# Patient Record
Sex: Female | Born: 1985 | Race: Black or African American | Hispanic: No | Marital: Single | Smoking: Former smoker
Health system: Southern US, Community
[De-identification: ages and names within clinical notes are randomized; demographics above are authoritative.]

## PROBLEM LIST (undated history)

## (undated) ENCOUNTER — Inpatient Hospital Stay (HOSPITAL_COMMUNITY): Payer: Self-pay

## (undated) ENCOUNTER — Inpatient Hospital Stay (HOSPITAL_COMMUNITY)

## (undated) DIAGNOSIS — Z789 Other specified health status: Secondary | ICD-10-CM

## (undated) DIAGNOSIS — A749 Chlamydial infection, unspecified: Secondary | ICD-10-CM

## (undated) HISTORY — PX: WISDOM TOOTH EXTRACTION: SHX21

---
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
## 2007-03-21 ENCOUNTER — Encounter: Admission: RE | Admit: 2007-03-21 | Discharge: 2007-03-21 | Payer: Self-pay | Admitting: Pediatrics

## 2007-08-20 ENCOUNTER — Emergency Department (HOSPITAL_COMMUNITY): Admission: EM | Admit: 2007-08-20 | Discharge: 2007-08-20 | Payer: Self-pay | Admitting: Emergency Medicine

## 2009-12-29 ENCOUNTER — Emergency Department (HOSPITAL_COMMUNITY): Admission: EM | Admit: 2009-12-29 | Discharge: 2009-12-29 | Payer: Self-pay | Admitting: Family Medicine

## 2009-12-29 ENCOUNTER — Emergency Department (HOSPITAL_COMMUNITY): Admission: EM | Admit: 2009-12-29 | Discharge: 2009-12-29 | Payer: Self-pay | Admitting: Emergency Medicine

## 2010-06-21 ENCOUNTER — Emergency Department (HOSPITAL_COMMUNITY)
Admission: EM | Admit: 2010-06-21 | Discharge: 2010-06-21 | Payer: Self-pay | Source: Home / Self Care | Admitting: Emergency Medicine

## 2010-08-16 LAB — DIFFERENTIAL
Basophils Absolute: 0 10*3/uL (ref 0.0–0.1)
Basophils Relative: 0 % (ref 0–1)
Eosinophils Absolute: 0.2 10*3/uL (ref 0.0–0.7)
Monocytes Relative: 4 % (ref 3–12)
Neutrophils Relative %: 66 % (ref 43–77)

## 2010-08-16 LAB — COMPREHENSIVE METABOLIC PANEL
ALT: 8 U/L (ref 0–35)
Alkaline Phosphatase: 73 U/L (ref 39–117)
CO2: 23 mEq/L (ref 19–32)
Chloride: 109 mEq/L (ref 96–112)
GFR calc non Af Amer: >60 mL/min (ref >60)
Glucose, Bld: 88 mg/dL (ref 70–99)
Potassium: 3.8 mEq/L (ref 3.5–5.1)
Sodium: 139 mEq/L (ref 135–145)
Total Bilirubin: 0.3 mg/dL (ref 0.3–1.2)

## 2010-08-16 LAB — CBC
HCT: 32.8 % — ABNORMAL LOW (ref 36.0–46.0)
Hemoglobin: 10.3 g/dL — ABNORMAL LOW (ref 12.0–15.0)
MCV: 79.6 fL (ref 78.0–100.0)
WBC: 8.1 10*3/uL (ref 4.0–10.5)

## 2010-08-16 LAB — POCT PREGNANCY, URINE: Preg Test, Ur: NEGATIVE

## 2010-10-17 NOTE — Discharge Summary (Signed)
[place]. [facility]  Patient:    Jamie Ramos, Jamie Ramos Visit Number: [id number] MRN: [id number]          Service Type: PED Location: PEDS 6123 01 Attending Physician:  Gerrianne Scale Dictated by:   Havery Moros for Alphonzo Lemmings Admit Date:  04/16/2001 Disc. Date: 04/21/01                             Discharge Summary  PRIMARY CARE PHYSICIAN:  Thayer Headings, M.D.  CONSULTS:  None.  DIAGNOSES: 1. Viral meningitis. 2. Headache.  PROCEDURES:  Lumbar tap.  CSF had 55 white blood cells; 43% were PMNs, 38% lymphocytes.  The glucose was 70.  The protein was 24.  CBC showed a white count of 9.2, hemoglobin 11.9, hematocrit 34.9 with platelet count of 274. Blood culture is no growth to date, and CSF culture is no growth to date.  A CT sinus scan was normal.  A UA revealed a concentration of 1.004, a pH of 6.5, uro-bili was 0.2, and it was negative for glucose, bilirubin, protein, blood, nitrites, and leukocyte esterase.  A CT scan of the head on 04/19/01 showed mild opacification of the posterior ethmoid cells and otherwise normal.  PRINCIPAL LABORATORY:  Noted above.  HOSPITAL COURSE:  The patient was admitted for viral meningitis with headache and fever.  She was started on Motrin and Tylenol for her headache and fever and also given a 20 cc per kg normal saline bolus and then started on 1-1/2 times maintenance IV fluids.  Through the course of her hospital stay, her maintenance IV fluids were decreased as her p.o. intake improved.  No antibiotics were started secondary to viral illness.  In order to control the headache, she was started on Motrin scheduled and Tylenol with codeine, giving some relief, but then started on 2 mg of morphine every 4 hours for 24 hours. As her headache improved, she was changed to Percocet, 5 mg of oxycodone for 325 mg of Tylenol and given that every 4 hours, scheduled for 24 hours and then every 4 hours as needed for pain.   This was also given with Motrin 400 mg every 4 hours scheduled.  As her headache improved, she was discharged on Percocet and Motrin.  DISCHARGE MEDICATIONS: 1. Percocet 5/325, 1 tab every 4 hours as needed for pain. 2. Motrin 400 mg every 4 hours as needed for pain.  FOLLOW-UP:  To be scheduled by the patient as needed.  DIET:  Regular.  ACTIVITY:  Unrestricted.  CONDITION ON DISCHARGE:  Improved and stable. Dictated by:   Havery Moros for Alphonzo Lemmings Attending Physician:  Lorain Childes B DD:  04/21/01 TD:  04/21/01 Job: [id number]

## 2011-02-23 LAB — DIFFERENTIAL
Basophils Relative: 0
Eosinophils Absolute: 0.1
Eosinophils Relative: 1
Monocytes Absolute: 0.5
Monocytes Relative: 4

## 2011-02-23 LAB — BASIC METABOLIC PANEL
CO2: 21
Chloride: 108
Creatinine, Ser: 0.69
GFR calc Af Amer: >60

## 2011-02-23 LAB — URINALYSIS, ROUTINE W REFLEX MICROSCOPIC
Glucose, UA: NEGATIVE
Protein, ur: NEGATIVE

## 2011-02-23 LAB — PREGNANCY, URINE: Preg Test, Ur: NEGATIVE

## 2011-02-23 LAB — CBC
Hemoglobin: 8.4 — ABNORMAL LOW
MCHC: 31.7
MCV: 78.9
RBC: 3.35 — ABNORMAL LOW

## 2014-09-06 ENCOUNTER — Emergency Department (HOSPITAL_COMMUNITY)
Admission: EM | Admit: 2014-09-06 | Discharge: 2014-09-06 | Disposition: A | Discharge status: Home / Self Care | Payer: Self-pay | Attending: Emergency Medicine | Admitting: Emergency Medicine

## 2014-09-06 ENCOUNTER — Encounter (HOSPITAL_COMMUNITY): Payer: Self-pay | Admitting: *Deleted

## 2014-09-06 DIAGNOSIS — J069 Acute upper respiratory infection, unspecified: Secondary | ICD-10-CM | POA: Insufficient documentation

## 2014-09-06 LAB — RAPID STREP SCREEN (MED CTR MEBANE ONLY): Streptococcus, Group A Screen (Direct): NEGATIVE

## 2014-09-06 MED ORDER — FLUTICASONE PROPIONATE 50 MCG/ACT NA SUSP: 2.0000 | Freq: Every day | NASAL | Status: DC

## 2014-09-06 NOTE — ED Provider Notes (Signed)
CSN: [id number]     Arrival date & time 09/06/14  1616 History  This chart was scribed for a non-physician practitioner, Kathrynn Speedobyn M Rolland Steinert, PA-C working with Azalia BilisKevin Campos, MD by [place] Peace, ED Scribe. The patient was seen in TR10C/TR10C. The patient's care was started at 4:35 PM.     Chief Complaint  Patient presents with  . Sore Throat      Patient is a 29 y.o. female presenting with pharyngitis. The history is provided by the patient. No language interpreter was used.  Sore Throat  HPI Comments: Shade FloodGlenda Ramos is a 29 y.o. female who presents to the Emergency Department complaining of sore throat x 2 days with dry cough, chills, congestion, and rhinorrhea. She explains throat pain is so bad that she does not even want to eat. 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 Pt has tried taking OTC medications without relief.    History reviewed. No pertinent past medical history. History reviewed. No pertinent past surgical history. History reviewed. No pertinent family history. History  Substance Use Topics  . Smoking status: Never Smoker   . Smokeless tobacco: Not on file  . Alcohol Use: Yes   OB History    No data available     Review of Systems  Constitutional: Positive for chills.  HENT: Positive for congestion, rhinorrhea, sore throat and trouble swallowing.   Respiratory: Positive for cough.   All other systems reviewed and are negative.     Allergies  Review of patient's allergies indicates no known allergies.  Home Medications   Prior to Admission medications   Medication Sig Start Date End Date Taking? Authorizing Provider  fluticasone (FLONASE) 50 MCG/ACT nasal spray Place 2 sprays into both nostrils daily. 09/06/14   Nicole Pisciotta, PA-C   BP 138/87 mmHg  Pulse 109  Temp(Src) 98 F (36.7 C)  Resp 16  Ht 5\' 2"  (1.575 m)  SpO2 100%  LMP 07/22/2014 Physical Exam   Constitutional: She is oriented to person, place, and time. She appears well-developed and well-nourished. No distress.  HENT:  Head: Normocephalic and atraumatic.  Nasal congestion, mucosal edema, postnasal drip.  Eyes: Conjunctivae and EOM are normal.  Neck: Normal range of motion. Neck supple.  Cardiovascular: Normal rate, regular rhythm and normal heart sounds.   Pulmonary/Chest: Effort normal and breath sounds normal. No respiratory distress.  Musculoskeletal: Normal range of motion. She exhibits no edema.  Lymphadenopathy:    She has no cervical adenopathy.  Neurological: She is alert and oriented to person, place, and time. No sensory deficit.  Skin: Skin is warm and dry.  Psychiatric: She has a normal mood and affect. Her behavior is normal.  Nursing note and vitals reviewed.   ED Course  Procedures (including critical care time) Labs Review Labs Reviewed  RAPID STREP SCREEN  CULTURE, GROUP A STREP    Imaging Review No results found.   EKG Interpretation None     Medications - No data to display  4:38 PM- Treatment plan was discussed with patient who verbalizes understanding and agrees.   MDM   Final diagnoses:  URI (upper respiratory infection)   Nontoxic appearing, NAD. AF VSS. No tachycardia on my exam. Nasal congestion, mucosal edema, postnasal drip on exam. Lungs clear. Rapid strep negative. Discussed systematic treatment. Stable for discharge. Return precautions given. Patient states understanding of treatment care plan and is agreeable.  I personally  performed the services described in this documentation, which was scribed in my presence. The recorded information has been reviewed and is accurate.  Kathrynn Speed, PA-C 09/06/14 1730  Azalia Bilis, MD 09/06/14 [id number]

## 2014-09-06 NOTE — Discharge Instructions (Signed)
Use nasal spray as directed. Rest and stay well-hydrated.  Upper Respiratory Infection, Adult An upper respiratory infection (URI) is also sometimes known as the common cold. The upper respiratory tract includes the nose, sinuses, throat, trachea, and bronchi. Bronchi are the airways leading to the lungs. Most people improve within 1 week, but symptoms can last up to 2 weeks. A residual cough may last even longer.  CAUSES Many different viruses can infect the tissues lining the upper respiratory tract. The tissues become irritated and inflamed and often become very moist. Mucus production is also common. A cold is contagious. You can easily spread the virus to others by oral contact. This includes kissing, sharing a glass, coughing, or sneezing. Touching your mouth or nose and then touching a surface, which is then touched by another person, can also spread the virus. SYMPTOMS  Symptoms typically develop 1 to 3 days after you come in contact with a cold virus. Symptoms vary from person to person. They may include:  Runny nose.  Sneezing.  Nasal congestion.  Sinus irritation.  Sore throat.  Loss of voice (laryngitis).  Cough.  Fatigue.  Muscle aches.  Loss of appetite.  Headache.  Low-grade fever. DIAGNOSIS  You might diagnose your own cold based on familiar symptoms, since most people get a cold 2 to 3 times a year. Your caregiver can confirm this based on your exam. Most importantly, your caregiver can check that your symptoms are not due to another disease such as strep throat, sinusitis, pneumonia, asthma, or epiglottitis. Blood tests, throat tests, and X-rays are not necessary to diagnose a common cold, but they may sometimes be helpful in excluding other more serious diseases. Your caregiver will decide if any further tests are required. RISKS AND COMPLICATIONS  You may be at risk for a more severe case of the common cold if you smoke cigarettes, have chronic heart disease  (such as heart failure) or lung disease (such as asthma), or if you have a weakened immune system. The very young and very old are also at risk for more serious infections. Bacterial sinusitis, middle ear infections, and bacterial pneumonia can complicate the common cold. The common cold can worsen asthma and chronic obstructive pulmonary disease (COPD). Sometimes, these complications can require emergency medical care and may be life-threatening. PREVENTION  The best way to protect against getting a cold is to practice good hygiene. Avoid oral or hand contact with people with cold symptoms. Wash your hands often if contact occurs. There is no clear evidence that vitamin C, vitamin E, echinacea, or exercise reduces the chance of developing a cold. However, it is always recommended to get plenty of rest and practice good nutrition. TREATMENT  Treatment is directed at relieving symptoms. There is no cure. Antibiotics are not effective, because the infection is caused by a virus, not by bacteria. Treatment may include:  Increased fluid intake. Sports drinks offer valuable electrolytes, sugars, and fluids.  Breathing heated mist or steam (vaporizer or shower).  Eating chicken soup or other clear broths, and maintaining good nutrition.  Getting plenty of rest.  Using gargles or lozenges for comfort.  Controlling fevers with ibuprofen or acetaminophen as directed by your caregiver.  Increasing usage of your inhaler if you have asthma. Zinc gel and zinc lozenges, taken in the first 24 hours of the common cold, can shorten the duration and lessen the severity of symptoms. Pain medicines may help with fever, muscle aches, and throat pain. A variety of non-prescription medicines  are available to treat congestion and runny nose. Your caregiver can make recommendations and may suggest nasal or lung inhalers for other symptoms.  HOME CARE INSTRUCTIONS   Only take over-the-counter or prescription medicines  for pain, discomfort, or fever as directed by your caregiver.  Use a warm mist humidifier or inhale steam from a shower to increase air moisture. This may keep secretions moist and make it easier to breathe.  Drink enough water and fluids to keep your urine clear or pale yellow.  Rest as needed.  Return to work when your temperature has returned to normal or as your caregiver advises. You may need to stay home longer to avoid infecting others. You can also use a face mask and careful hand washing to prevent spread of the virus. SEEK MEDICAL CARE IF:   After the first few days, you feel you are getting worse rather than better.  You need your caregiver's advice about medicines to control symptoms.  You develop chills, worsening shortness of breath, or brown or red sputum. These may be signs of pneumonia.  You develop yellow or brown nasal discharge or pain in the face, especially when you bend forward. These may be signs of sinusitis.  You develop a fever, swollen neck glands, pain with swallowing, or white areas in the back of your throat. These may be signs of strep throat. SEEK IMMEDIATE MEDICAL CARE IF:   You have a fever.  You develop severe or persistent headache, ear pain, sinus pain, or chest pain.  You develop wheezing, a prolonged cough, cough up blood, or have a change in your usual mucus (if you have chronic lung disease).  You develop sore muscles or a stiff neck. Document Released: 11/11/2000 Document Revised: 08/10/2011 Document Reviewed: 08/23/2013 [facility] Patient Information 2015 [employer] [place]. This information is not intended to replace advice given to you by your health care provider. Make sure you discuss any questions you have with your health care provider.

## 2014-09-06 NOTE — ED Notes (Signed)
Pt to ED c/o sore throat and chills since Tuesday. Reports painful to swallow. Also c/o NVD, last diarrhea was on Monday. 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

## 2014-09-06 NOTE — ED Notes (Signed)
Pt c/o sore throat and chills since Tuesday. Reports difficulty swallowing. Pt has tried OTC cold medications without relief

## 2014-09-10 LAB — CULTURE, GROUP A STREP

## 2015-12-12 ENCOUNTER — Encounter (HOSPITAL_COMMUNITY): Payer: Self-pay | Admitting: *Deleted

## 2015-12-12 DIAGNOSIS — N760 Acute vaginitis: Secondary | ICD-10-CM | POA: Insufficient documentation

## 2015-12-12 DIAGNOSIS — B9689 Other specified bacterial agents as the cause of diseases classified elsewhere: Secondary | ICD-10-CM | POA: Insufficient documentation

## 2015-12-12 LAB — URINALYSIS, ROUTINE W REFLEX MICROSCOPIC
GLUCOSE, UA: NEGATIVE mg/dL
HGB URINE DIPSTICK: NEGATIVE
KETONES UR: 15 mg/dL — AB
Nitrite: NEGATIVE
PROTEIN: NEGATIVE mg/dL
Specific Gravity, Urine: 1.033 — ABNORMAL HIGH (ref 1.005–1.030)
pH: 7 (ref 5.0–8.0)

## 2015-12-12 LAB — CBC
HCT: 37 % (ref 36.0–46.0)
Hemoglobin: 11.8 g/dL — ABNORMAL LOW (ref 12.0–15.0)
MCH: 25.8 pg — AB (ref 26.0–34.0)
MCHC: 31.9 g/dL (ref 30.0–36.0)
MCV: 81 fL (ref 78.0–100.0)
PLATELETS: 292 10*3/uL (ref 150–400)
RBC: 4.57 MIL/uL (ref 3.87–5.11)
RDW: 18 % — AB (ref 11.5–15.5)
WBC: 11.8 10*3/uL — AB (ref 4.0–10.5)

## 2015-12-12 LAB — COMPREHENSIVE METABOLIC PANEL
ALK PHOS: 50 U/L (ref 38–126)
ALT: 10 U/L — AB (ref 14–54)
AST: 22 U/L (ref 15–41)
Albumin: 4.3 g/dL (ref 3.5–5.0)
Anion gap: 6 (ref 5–15)
BUN: 7 mg/dL (ref 6–20)
CHLORIDE: 109 mmol/L (ref 101–111)
CO2: 23 mmol/L (ref 22–32)
CREATININE: 0.76 mg/dL (ref 0.44–1.00)
Calcium: 9.3 mg/dL (ref 8.9–10.3)
GFR calc Af Amer: >60 mL/min (ref >60)
Glucose, Bld: 97 mg/dL (ref 65–99)
Potassium: 4 mmol/L (ref 3.5–5.1)
Sodium: 138 mmol/L (ref 135–145)
Total Bilirubin: 0.6 mg/dL (ref 0.3–1.2)
Total Protein: 7.6 g/dL (ref 6.5–8.1)

## 2015-12-12 LAB — URINE MICROSCOPIC-ADD ON

## 2015-12-12 LAB — POC URINE PREG, ED: Preg Test, Ur: NEGATIVE

## 2015-12-12 LAB — LIPASE, BLOOD: LIPASE: 25 U/L (ref 11–51)

## 2015-12-12 NOTE — ED Notes (Signed)
Pt states that she vomited this morning and when she went to the bathroom she had some clear vaginal discharge. Endorses lower abd pain.

## 2015-12-13 ENCOUNTER — Emergency Department (HOSPITAL_COMMUNITY)
Admission: EM | Admit: 2015-12-13 | Discharge: 2015-12-13 | Disposition: A | Discharge status: Home / Self Care | Payer: Self-pay | Attending: Emergency Medicine | Admitting: Emergency Medicine

## 2015-12-13 ENCOUNTER — Emergency Department (HOSPITAL_COMMUNITY): Payer: Self-pay

## 2015-12-13 DIAGNOSIS — N760 Acute vaginitis: Secondary | ICD-10-CM

## 2015-12-13 DIAGNOSIS — B9689 Other specified bacterial agents as the cause of diseases classified elsewhere: Secondary | ICD-10-CM

## 2015-12-13 DIAGNOSIS — R1031 Right lower quadrant pain: Secondary | ICD-10-CM

## 2015-12-13 LAB — GC/CHLAMYDIA PROBE AMP (~~LOC~~) NOT AT ARMC
Chlamydia: NEGATIVE
Neisseria Gonorrhea: NEGATIVE

## 2015-12-13 LAB — WET PREP, GENITAL
SPERM: NONE SEEN
Trich, Wet Prep: NONE SEEN
Yeast Wet Prep HPF POC: NONE SEEN

## 2015-12-13 LAB — HIV ANTIBODY (ROUTINE TESTING W REFLEX): HIV Screen 4th Generation wRfx: NONREACTIVE

## 2015-12-13 MED ORDER — METRONIDAZOLE 500 MG PO TABS: 500.0000 mg | ORAL_TABLET | Freq: Two times a day (BID) | ORAL | Status: DC

## 2015-12-13 MED ORDER — AZITHROMYCIN 250 MG PO TABS
1000.0000 mg | ORAL_TABLET | Freq: Once | ORAL | Status: AC
  Administered 2015-12-13: 1000 mg via ORAL
  Filled 2015-12-13: qty 4

## 2015-12-13 MED ORDER — CEFTRIAXONE SODIUM 250 MG IJ SOLR
250.0000 mg | Freq: Once | INTRAMUSCULAR | Status: AC
  Administered 2015-12-13: 250 mg via INTRAMUSCULAR
  Filled 2015-12-13: qty 250

## 2015-12-13 NOTE — ED Provider Notes (Signed)
CSN: [id number]     Arrival date & time 12/12/15  2150 History   First MD Initiated Contact with Patient 12/13/15 0204     Chief Complaint  Patient presents with  . Emesis  . Vaginal Discharge     (Consider location/radiation/quality/duration/timing/severity/associated sxs/prior Treatment) HPI Comments: Patient presents today with a chief complaint of lower abdominal cramping and vaginal discharge onset earlier today.  She states that the discharge was whitish in color and did not notice an odor to it.  She reports one episode of vomiting this morning, but denies any nausea or vomiting at this time.  Denies diarrhea, constipation, abnormal vaginal bleeding, fever, chills, or urinary symptoms.  She is sexually active.  LMP was 11/26/15 and was normal.    The history is provided by the patient.    History reviewed. No pertinent past medical history. History reviewed. No pertinent past surgical history. No family history on file. Social History  Substance Use Topics  . Smoking status: Never Smoker   . Smokeless tobacco: None  . Alcohol Use: Yes   OB History    No data available     Review of Systems  All other systems reviewed and are negative.     Allergies  Review of patient's allergies indicates no known allergies.  Home Medications   Prior to Admission medications   Medication Sig Start Date End Date Taking? Authorizing Provider  fluticasone (FLONASE) 50 MCG/ACT nasal spray Place 2 sprays into both nostrils daily. Patient not taking: Reported on 12/13/2015 09/06/14   Joni Reining Pisciotta, PA-C   BP 142/96 mmHg  Pulse 101  Temp(Src) 98.7 F (37.1 C) (Oral)  Resp 16  Ht  (1.575 m)  Wt 46.834 kg  BMI 18.88 kg/m2  SpO2 100%  LMP 11/26/2015 Physical Exam  Constitutional: She appears well-developed and well-nourished.  HENT:  Head: Normocephalic and atraumatic.  Mouth/Throat: Oropharynx is clear and moist.  Neck: Normal range of motion. Neck supple.   Cardiovascular: Normal rate, regular rhythm and normal heart sounds.   Pulmonary/Chest: Effort normal and breath sounds normal.  Abdominal: Soft. Bowel sounds are normal. She exhibits no distension and no mass. There is no tenderness. There is no rebound and no guarding.  Genitourinary: Cervix exhibits no motion tenderness. Right adnexum displays tenderness. Right adnexum displays no mass and no fullness. Left adnexum displays no mass, no tenderness and no fullness.  Whitish colored discharge in the vaginal vault  Musculoskeletal: Normal range of motion.  Neurological: She is alert.  Skin: Skin is warm and dry.  Psychiatric: She has a normal mood and affect.  Nursing note and vitals reviewed.   ED Course  Procedures (including critical care time) Labs Review Labs Reviewed  COMPREHENSIVE METABOLIC PANEL - Abnormal; Notable for the following:    ALT 10 (*)    All other components within normal limits  CBC - Abnormal; Notable for the following:    WBC 11.8 (*)    Hemoglobin 11.8 (*)    MCH 25.8 (*)    RDW 18.0 (*)    All other components within normal limits  URINALYSIS, ROUTINE W REFLEX MICROSCOPIC (NOT AT [facility]) - Abnormal; Notable for the following:    Color, Urine AMBER (*)    Specific Gravity, Urine 1.033 (*)    Bilirubin Urine SMALL (*)    Ketones, ur 15 (*)    Leukocytes, UA TRACE (*)    All other components within normal limits  URINE MICROSCOPIC-ADD ON - Abnormal; Notable for  the following:    Squamous Epithelial / LPF 6-30 (*)    Bacteria, UA FEW (*)    All other components within normal limits  WET PREP, GENITAL  LIPASE, BLOOD  HIV ANTIBODY (ROUTINE TESTING)  POC URINE PREG, ED  GC/CHLAMYDIA PROBE AMP ([place]) [facility]    Imaging Review [place] Transvaginal Non-ob  12/13/2015  CLINICAL DATA:  Lower pelvic pain for 1 day, greater on the right. Negative pregnancy test. EXAM: TRANSABDOMINAL AND TRANSVAGINAL ULTRASOUND OF PELVIS TECHNIQUE: Both transabdominal and  transvaginal ultrasound examinations of the pelvis were performed. Transabdominal technique was performed for global imaging of the pelvis including uterus, ovaries, adnexal regions, and pelvic cul-de-sac. It was necessary to proceed with endovaginal exam following the transabdominal exam to visualize the uterus and ovaries. COMPARISON:  CT abdomen and pelvis 12/29/2009 FINDINGS: Uterus Measurements: 5.1 x 3.3 x 4 cm. No fibroids or other mass visualized. Endometrium Thickness: 9 mm.  No focal abnormality visualized. Right ovary Measurements: 2.5 x 1.7 x 2.5 cm. Normal follicular changes are demonstrated. No abnormal adnexal masses. Left ovary Measurements: 2.4 x 1.4 x 1.7 cm. Normal follicular changes are demonstrated. No abnormal adnexal masses. Other findings No abnormal free fluid. IMPRESSION: Normal ultrasound appearance of the uterus and ovaries. Electronically Signed   By: Burman NievesWilliam  Stevens M.D.   On: 12/13/2015 04:49   [place] Pelvis Complete  12/13/2015  CLINICAL DATA:  Lower pelvic pain for 1 day, greater on the right. Negative pregnancy test. EXAM: TRANSABDOMINAL AND TRANSVAGINAL ULTRASOUND OF PELVIS TECHNIQUE: Both transabdominal and transvaginal ultrasound examinations of the pelvis were performed. Transabdominal technique was performed for global imaging of the pelvis including uterus, ovaries, adnexal regions, and pelvic cul-de-sac. It was necessary to proceed with endovaginal exam following the transabdominal exam to visualize the uterus and ovaries. COMPARISON:  CT abdomen and pelvis 12/29/2009 FINDINGS: Uterus Measurements: 5.1 x 3.3 x 4 cm. No fibroids or other mass visualized. Endometrium Thickness: 9 mm.  No focal abnormality visualized. Right ovary Measurements: 2.5 x 1.7 x 2.5 cm. Normal follicular changes are demonstrated. No abnormal adnexal masses. Left ovary Measurements: 2.4 x 1.4 x 1.7 cm. Normal follicular changes are demonstrated. No abnormal adnexal masses. Other findings No abnormal  free fluid. IMPRESSION: Normal ultrasound appearance of the uterus and ovaries. Electronically Signed   By: Burman NievesWilliam  Stevens M.D.   On: 12/13/2015 04:49   I have personally reviewed and evaluated these images and lab results as part of my medical decision-making.   EKG Interpretation None      MDM   Final diagnoses:  RLQ abdominal pain   Patient presents today with a chief complaint of vaginal discharge and lower abdominal cramping.  Urine pregnancy is negative.  Wet prep showing clue cells consistent with BV.  GC/Chlamydia pending.  Patient given empiric treatment with Azithromycin and Rocephin.  No CMT with pelvic exam.  Right adnexal tenderness on exam.  Pelvic ultrasound is negative.  Patient stable for discharge.  Return precautions given.      Santiago GladHeather Gurnoor Ursua, PA-C 12/15/15 1536  Tomasita CrumbleAdeleke Oni, MD 12/15/15 2340

## 2015-12-13 NOTE — ED Notes (Signed)
Patient transported to Ultrasound 

## 2016-05-21 ENCOUNTER — Encounter (HOSPITAL_COMMUNITY): Payer: Self-pay

## 2016-05-21 ENCOUNTER — Emergency Department (HOSPITAL_COMMUNITY)
Admission: EM | Admit: 2016-05-21 | Discharge: 2016-05-21 | Disposition: A | Discharge status: Home / Self Care | Payer: Self-pay | Attending: Emergency Medicine | Admitting: Emergency Medicine

## 2016-05-21 DIAGNOSIS — Y929 Unspecified place or not applicable: Secondary | ICD-10-CM | POA: Insufficient documentation

## 2016-05-21 DIAGNOSIS — S0501XA Injury of conjunctiva and corneal abrasion without foreign body, right eye, initial encounter: Secondary | ICD-10-CM | POA: Insufficient documentation

## 2016-05-21 DIAGNOSIS — X58XXXA Exposure to other specified factors, initial encounter: Secondary | ICD-10-CM | POA: Insufficient documentation

## 2016-05-21 DIAGNOSIS — Y939 Activity, unspecified: Secondary | ICD-10-CM | POA: Insufficient documentation

## 2016-05-21 DIAGNOSIS — Y999 Unspecified external cause status: Secondary | ICD-10-CM | POA: Insufficient documentation

## 2016-05-21 MED ORDER — ERYTHROMYCIN 5 MG/GM OP OINT: TOPICAL_OINTMENT | OPHTHALMIC | 0 refills | Status: DC

## 2016-05-21 MED ORDER — FLUORESCEIN SODIUM 0.6 MG OP STRP
1.0000 | ORAL_STRIP | Freq: Once | OPHTHALMIC | Status: AC
  Administered 2016-05-21: 1 via OPHTHALMIC
  Filled 2016-05-21: qty 1

## 2016-05-21 MED ORDER — TETRACAINE HCL 0.5 % OP SOLN
2.0000 [drp] | Freq: Once | OPHTHALMIC | Status: AC
  Administered 2016-05-21: 2 [drp] via OPHTHALMIC
  Filled 2016-05-21: qty 2

## 2016-05-21 NOTE — ED Provider Notes (Signed)
[facility]-EMERGENCY DEPT Provider Note   CSN: [id number] Arrival date & time: 05/21/16  [id number]   By signing my name below, I, Jamie Ramos, attest that this documentation has been prepared under the direction and in the presence of [facility] Hospital[facility] Burl Tauzin, PA-C. Electronically Signed: Talbert NanPaul Ramos, Scribe. 05/21/16. 10:19 AM. History   Chief Complaint Chief Complaint  Patient presents with  . Conjunctivitis   HPI Comments: Jamie Ramos is a 30 y.o. female who presents to the Emergency Department complaining of gradual onset constant, gradually improving eye redness and irritation that began 2 days ago. The patient says that she wears cosmetic contact lens and had to remove it due to the irritation. She describes the irritation as a burning sensation. She placed a warm compress and applied OTC eye drops with limited relief. She states the following morning she woke up to eye swelling and continued redness. She reports associated photophobia. She denies congestion, eye matting.     The history is provided by the patient. No language interpreter was used.    History reviewed. No pertinent past medical history.  There are no active problems to display for this patient.   History reviewed. No pertinent surgical history.  OB History    No data available       Home Medications    Prior to Admission medications   Medication Sig Start Date End Date Taking? Authorizing Provider  erythromycin ophthalmic ointment Place a 1/2 inch ribbon of ointment into the lower eyelid x 1 week 05/21/16   [facility] Pilcher Stevenson Windmiller, PA-C  fluticasone [facility](FLONASE) 50 MCG/ACT nasal spray Place 2 sprays into both nostrils daily. Patient not taking: Reported on 12/13/2015 09/06/14   Joni ReiningNicole Pisciotta, PA-C  metroNIDAZOLE (FLAGYL) 500 MG tablet Take 1 tablet (500 mg total) by mouth 2 (two) times daily. 12/13/15   Santiago GladHeather Laisure, PA-C    Family History No family history on file.  Social History Social History  Substance Use Topics  .  Smoking status: Never Smoker  . Smokeless tobacco: Not on file  . Alcohol use Yes     Allergies   Patient has no known allergies.   Review of Systems Review of Systems  Constitutional: Negative for fever.  HENT: Negative for congestion.   Eyes: Positive for photophobia, pain, redness and itching. Negative for discharge.     Physical Exam Updated Vital Signs BP 124/79 (BP Location: Left Arm)   Pulse 89   Temp 97.9 F (36.6 C) (Oral)   Resp 14   Ht 5\' 2"  (1.575 m)   Wt 48.5 kg   LMP 04/12/2016 (Within Days)   SpO2 98%   BMI 19.57 kg/m   Physical Exam  Constitutional: She is oriented to person, place, and time. She appears well-developed and well-nourished. No distress.  HENT:  Head: Normocephalic and atraumatic.  Eyes: EOM and lids are normal. Pupils are equal, round, and reactive to light. Lids are everted and swept, no foreign bodies found. Right conjunctiva is injected. Left conjunctiva is not injected.    Fluorescein uptake as depicted in image  Cardiovascular: Normal rate, regular rhythm and normal heart sounds.   No murmur heard. Pulmonary/Chest: Effort normal and breath sounds normal. No respiratory distress.  Abdominal: Soft. She exhibits no distension. There is no tenderness.  Musculoskeletal: She exhibits no edema.  Neurological: She is alert and oriented to person, place, and time.  Skin: Skin is warm and dry.  Nursing note and vitals reviewed.    ED Treatments / Results  DIAGNOSTIC STUDIES: Oxygen Saturation is 98% on room air, normal by my interpretation.    COORDINATION OF CARE: 10:15 AM Discussed treatment plan with pt at bedside and pt agreed to plan.    Labs (all labs ordered are listed, but only abnormal results are displayed) Labs Reviewed - No data to display  EKG  EKG Interpretation None       Radiology No results found.  Procedures Procedures (including critical care time)  Medications Ordered in ED Medications    tetracaine (PONTOCAINE) 0.5 % ophthalmic solution 2 drop (2 drops Right Eye Given 05/21/16 1019)  fluorescein ophthalmic strip 1 strip (1 strip Right Eye Given 05/21/16 1019)     Initial Impression / Assessment and Plan / ED Course  I have reviewed the triage vital signs and the nursing notes.  Pertinent labs & imaging results that were available during my care of the patient were reviewed by me and considered in my medical decision making (see chart for details).  Clinical Course    Jamie Ramos Wentling is a 30 y.o. female who presents to ED for right eye redness and foreign body sensation. Eye was stained and shows evidence of corneal abrasion. Conjunctiva is injected as well. Will treat with erythromycin ointment. Personal hygiene and frequent handwashing discussed. Patient advised to follow up with ophthalmologist if symptoms persist or worsen. Return precautions discussed. Patient verbalizes understanding and is agreeable with discharge.   Final Clinical Impressions(s) / ED Diagnoses   Final diagnoses:  Abrasion of right cornea, initial encounter    New Prescriptions Discharge Medication List as of 05/21/2016 10:22 AM    START taking these medications   Details  erythromycin ophthalmic ointment Place a 1/2 inch ribbon of ointment into the lower eyelid x 1 week, Print        I personally performed the services described in this documentation, which was scribed in my presence. The recorded information has been reviewed and is accurate.     [facility] Pilcher Opaline Reyburn, PA-C 05/21/16 1132    Gwyneth SproutWhitney Plunkett, MD 05/23/16 [id number]

## 2016-05-21 NOTE — Discharge Instructions (Signed)
It was my pleasure taking care of you today!  Please take all of your antibiotics until finished!  If your symptoms do not improve in 3 days, please follow up with an eye doctor of your choice or the eye doctor listed. Return to ER for new or worsening symptoms, any additional concerns.

## 2016-05-21 NOTE — ED Triage Notes (Signed)
Pt presents for evaluation of possible pink eye to R eye since Tuesday. Pt. States she does wear contacts, states has been using OTC eye drops with no improvement. Pt AxO x4, in NAD.

## 2016-05-21 NOTE — ED Notes (Signed)
Pt is in stable condition upon d/c and ambulates from ED. 

## 2016-08-16 ENCOUNTER — Encounter (HOSPITAL_COMMUNITY): Payer: Self-pay | Admitting: *Deleted

## 2016-08-16 ENCOUNTER — Emergency Department (HOSPITAL_COMMUNITY)
Admission: EM | Admit: 2016-08-16 | Discharge: 2016-08-16 | Disposition: A | Discharge status: Home / Self Care | Payer: Self-pay | Attending: Emergency Medicine | Admitting: Emergency Medicine

## 2016-08-16 ENCOUNTER — Emergency Department (HOSPITAL_COMMUNITY): Payer: Self-pay

## 2016-08-16 DIAGNOSIS — R0789 Other chest pain: Secondary | ICD-10-CM | POA: Insufficient documentation

## 2016-08-16 DIAGNOSIS — Z79899 Other long term (current) drug therapy: Secondary | ICD-10-CM | POA: Insufficient documentation

## 2016-08-16 DIAGNOSIS — F17200 Nicotine dependence, unspecified, uncomplicated: Secondary | ICD-10-CM | POA: Insufficient documentation

## 2016-08-16 MED ORDER — IBUPROFEN 600 MG PO TABS: 600.0000 mg | ORAL_TABLET | Freq: Four times a day (QID) | ORAL | 0 refills | Status: DC | PRN

## 2016-08-16 NOTE — ED Triage Notes (Signed)
Since [place] food [place] Monday, feels like something may be in epigastric area, little anxiety causing her to feel as if she has to make herself take a deep breath. URI symptoms with congestion

## 2016-08-16 NOTE — ED Provider Notes (Signed)
[facility]-EMERGENCY DEPT Provider Note   CSN: [id number] Arrival date & time: 08/16/16  1228     History   Chief Complaint Chief Complaint  Patient presents with  . Chest Pain    HPI Jamie Ramos is a 31 y.o. female.  The history is provided by the patient.  Chest Pain   This is a new problem. The current episode started more than 1 week ago. The problem occurs constantly. The problem has not changed since onset.The pain is associated with an emotional upset (ate chips at [employer] last week and it got caught in her esophagus. ). The pain is present in the substernal region. The pain is moderate. The quality of the pain is described as pressure-like. The pain does not radiate. Associated symptoms include shortness of breath. Pertinent negatives include no cough, no dizziness, no exertional chest pressure, no fever, no irregular heartbeat, no lower extremity edema, no malaise/fatigue, no orthopnea and no sputum production. Risk factors include smoking/tobacco exposure.  Pertinent negatives for past medical history include no CAD, no COPD, no CHF, no diabetes, no DVT, no hyperlipidemia, no hypertension, no MI and no PE.    History reviewed. No pertinent past medical history.  There are no active problems to display for this patient.   History reviewed. No pertinent surgical history.  OB History    No data available       Home Medications    Prior to Admission medications   Medication Sig Start Date End Date Taking? Authorizing Provider  Phenylephrine-DM-GG-APAP (DELSYM COUGH/COLD DAYTIME) 5-10-200-325 MG/10ML LIQD Take 30 mLs by mouth every 6 (six) hours as needed (COUGH,COLD).   Yes Historical Provider, MD  erythromycin ophthalmic ointment Place a 1/2 inch ribbon of ointment into the lower eyelid x 1 week Patient not taking: Reported on 08/16/2016 05/21/16   [facility], PA-C  ibuprofen (ADVIL,MOTRIN) 600 MG tablet Take 1 tablet (600 mg total) by mouth every 6  (six) hours as needed. 08/16/16   Nira Conn, MD    Family History No family history on file.  Social History Social History  Substance Use Topics  . Smoking status: Current Some Day Smoker  . Smokeless tobacco: Never Used  . Alcohol use Yes     Allergies   Patient has no known allergies.   Review of Systems Review of Systems  Constitutional: Negative for fever and malaise/fatigue.  Respiratory: Positive for shortness of breath. Negative for cough and sputum production.   Cardiovascular: Positive for chest pain. Negative for orthopnea.  Neurological: Negative for dizziness.  Ten systems are reviewed and are negative for acute change except as noted in the HPI    Physical Exam Updated Vital Signs BP (!) 137/92   Pulse 86   Temp 98.2 F (36.8 C)   Resp 18   Ht 5\' 2"  (1.575 m)   Wt 112 lb (50.8 kg)   SpO2 100%   BMI 20.49 kg/m   Physical Exam  Constitutional: She is oriented to person, place, and time. She appears well-developed and well-nourished. No distress.  HENT:  Head: Normocephalic and atraumatic.  Nose: Nose normal.  Eyes: Conjunctivae and EOM are normal. Pupils are equal, round, and reactive to light. Right eye exhibits no discharge. Left eye exhibits no discharge. No scleral icterus.  Neck: Normal range of motion. Neck supple.  Cardiovascular: Normal rate and regular rhythm.  Exam reveals no gallop and no friction rub.   No murmur heard. Pulmonary/Chest: Effort normal and breath  sounds normal. No stridor. No respiratory distress. She has no rales. She exhibits tenderness.    Abdominal: Soft. She exhibits no distension. There is no tenderness.  Musculoskeletal: She exhibits no edema or tenderness.  Neurological: She is alert and oriented to person, place, and time.  Skin: Skin is warm and dry. No rash noted. She is not diaphoretic. No erythema.  Psychiatric: She has a normal mood and affect.  Vitals reviewed.    ED Treatments / Results    Labs (all labs ordered are listed, but only abnormal results are displayed) Labs Reviewed - No data to display  EKG  EKG Interpretation  Date/Time:  Sunday August 16 2016 16:28:06 EDT Ventricular Rate:  68 PR Interval:    QRS Duration: 84 QT Interval:  395 QTC Calculation: 421 R Axis:   66 Text Interpretation:  Sinus rhythm Probable left atrial enlargement No STEMI No old tracing to compare Confirmed by [facility] LPCARDAMA MD, PEDRO [id number]) on 08/16/2016 4:40:54 PM       Radiology Dg Chest 2 View  Result Date: 08/16/2016 CLINICAL DATA:  Dry cough and mid chest discomfort. EXAM: CHEST  2 VIEW COMPARISON:  None. FINDINGS: The heart size and mediastinal contours are within normal limits. Both lungs are clear. The visualized skeletal structures are unremarkable. IMPRESSION: No active cardiopulmonary disease. Electronically Signed   By: Ted Mcalpineobrinka  Dimitrova M.D.   On: 08/16/2016 14:03    Procedures Procedures (including critical care time)  Medications Ordered in ED Medications - No data to display   Initial Impression / Assessment and Plan / ED Course  I have reviewed the triage vital signs and the nursing notes.  Pertinent labs & imaging results that were available during my care of the patient were reviewed by me and considered in my medical decision making (see chart for details).     Atypical chest pain inconsistent with ACS.  EKG: Normal sinus rhythm, intervals, axis. No evidence of acute ischemia, arrhythmias, or blocks. Chest x-ray without evidence suggestive of pneumonia, pneumothorax, pneumomediastinum.  No abnormal contour of the mediastinum to suggest dissection. No evidence of acute injuries. PERC negative. Not classic for dissection or esophageal perforation. Most consistent with chest wall pain given the complete reproduction with palpation.  The patient is safe for discharge with strict return precautions.   Final Clinical Impressions(s) / ED Diagnoses   Final diagnoses:   Chest wall pain   Disposition: Discharge  Condition: Good  I have discussed the results, Dx and Tx plan with the patient who expressed understanding and agree(s) with the plan. Discharge instructions discussed at great length. The patient was given strict return precautions who verbalized understanding of the instructions. No further questions at time of discharge.    New Prescriptions   IBUPROFEN (ADVIL,MOTRIN) 600 MG TABLET    Take 1 tablet (600 mg total) by mouth every 6 (six) hours as needed.    Follow Up: Billee CashingWayland McKenzie, MD [address] [place] [id number]  Schedule an appointment as soon as possible for a visit  in 5-7 days, If symptoms do not improve or  worsen      Nira ConnPedro Eduardo Cardama, MD 08/16/16 1642

## 2017-06-01 DIAGNOSIS — A749 Chlamydial infection, unspecified: Secondary | ICD-10-CM

## 2017-06-01 HISTORY — DX: Chlamydial infection, unspecified: A74.9

## 2018-04-04 ENCOUNTER — Inpatient Hospital Stay (HOSPITAL_COMMUNITY): Payer: Medicaid Other

## 2018-04-04 ENCOUNTER — Inpatient Hospital Stay (HOSPITAL_COMMUNITY)
Admission: AD | Admit: 2018-04-04 | Discharge: 2018-04-04 | Disposition: A | Discharge status: Home / Self Care | Payer: Medicaid Other | Source: Ambulatory Visit | Attending: Obstetrics and Gynecology | Admitting: Obstetrics and Gynecology

## 2018-04-04 ENCOUNTER — Other Ambulatory Visit: Payer: Self-pay

## 2018-04-04 ENCOUNTER — Encounter (HOSPITAL_COMMUNITY): Payer: Self-pay | Admitting: *Deleted

## 2018-04-04 DIAGNOSIS — R1031 Right lower quadrant pain: Secondary | ICD-10-CM | POA: Diagnosis present

## 2018-04-04 DIAGNOSIS — O26891 Other specified pregnancy related conditions, first trimester: Secondary | ICD-10-CM

## 2018-04-04 DIAGNOSIS — Z3A01 Less than 8 weeks gestation of pregnancy: Secondary | ICD-10-CM | POA: Diagnosis not present

## 2018-04-04 DIAGNOSIS — O23591 Infection of other part of genital tract in pregnancy, first trimester: Secondary | ICD-10-CM | POA: Diagnosis not present

## 2018-04-04 DIAGNOSIS — O23599 Infection of other part of genital tract in pregnancy, unspecified trimester: Secondary | ICD-10-CM

## 2018-04-04 DIAGNOSIS — B9689 Other specified bacterial agents as the cause of diseases classified elsewhere: Secondary | ICD-10-CM | POA: Diagnosis not present

## 2018-04-04 DIAGNOSIS — R109 Unspecified abdominal pain: Secondary | ICD-10-CM

## 2018-04-04 HISTORY — DX: Other specified health status: Z78.9

## 2018-04-04 LAB — CBC WITH DIFFERENTIAL/PLATELET
Basophils Absolute: 0 10*3/uL (ref 0.0–0.1)
Basophils Relative: 0 %
EOS PCT: 2 %
Eosinophils Absolute: 0.4 10*3/uL (ref 0.0–0.5)
HEMATOCRIT: 45.2 % (ref 36.0–46.0)
Hemoglobin: 15.6 g/dL — ABNORMAL HIGH (ref 12.0–15.0)
LYMPHS ABS: 5.3 10*3/uL — AB (ref 0.7–4.0)
LYMPHS PCT: 34 %
MCH: 33.7 pg (ref 26.0–34.0)
MCHC: 34.5 g/dL (ref 30.0–36.0)
MCV: 97.6 fL (ref 80.0–100.0)
Monocytes Absolute: 0.8 10*3/uL (ref 0.1–1.0)
Monocytes Relative: 5 %
NEUTROS PCT: 59 %
NRBC: 0 % (ref 0.0–0.2)
Neutro Abs: 9.2 10*3/uL — ABNORMAL HIGH (ref 1.7–7.7)
Platelets: 301 10*3/uL (ref 150–400)
RBC: 4.63 MIL/uL (ref 3.87–5.11)
RDW: 12.4 % (ref 11.5–15.5)
WBC: 15.7 10*3/uL — ABNORMAL HIGH (ref 4.0–10.5)

## 2018-04-04 LAB — OB RESULTS CONSOLE ABO/RH: RH Type: POSITIVE

## 2018-04-04 LAB — OB RESULTS CONSOLE RPR: RPR: NONREACTIVE

## 2018-04-04 LAB — URINALYSIS, ROUTINE W REFLEX MICROSCOPIC
Bilirubin Urine: NEGATIVE
GLUCOSE, UA: NEGATIVE mg/dL
Hgb urine dipstick: NEGATIVE
KETONES UR: 5 mg/dL — AB
Nitrite: NEGATIVE
PH: 7 (ref 5.0–8.0)
Protein, ur: NEGATIVE mg/dL
Specific Gravity, Urine: 1.011 (ref 1.005–1.030)

## 2018-04-04 LAB — HCG, QUANTITATIVE, PREGNANCY: hCG, Beta Chain, Quant, S: 16532 m[IU]/mL — ABNORMAL HIGH (ref <5)

## 2018-04-04 LAB — ABO/RH: ABO/RH(D): O POS

## 2018-04-04 LAB — WET PREP, GENITAL
SPERM: NONE SEEN
Trich, Wet Prep: NONE SEEN
Yeast Wet Prep HPF POC: NONE SEEN

## 2018-04-04 LAB — OB RESULTS CONSOLE HEPATITIS B SURFACE ANTIGEN: Hepatitis B Surface Ag: NEGATIVE

## 2018-04-04 LAB — OB RESULTS CONSOLE RUBELLA ANTIBODY, IGM: Rubella: IMMUNE

## 2018-04-04 MED ORDER — METRONIDAZOLE 500 MG PO TABS: 500.0000 mg | ORAL_TABLET | Freq: Two times a day (BID) | ORAL | 0 refills | Status: AC

## 2018-04-04 NOTE — Discharge Instructions (Signed)
Abdominal Pain During Pregnancy °Abdominal pain is common in pregnancy. Most of the time, it does not cause harm. There are many causes of abdominal pain. Some causes are more serious than others and sometimes the cause is not known. Abdominal pain can be a sign that something is very wrong with the pregnancy or the pain may have nothing to do with the pregnancy. Always tell your health care provider if you have any abdominal pain. °Follow these instructions at home: °· Do not have sex or put anything in your vagina until your symptoms go away completely. °· Watch your abdominal pain for any changes. °· Get plenty of rest until your pain improves. °· Drink enough fluid to keep your urine clear or pale yellow. °· Take over-the-counter or prescription medicines only as told by your health care provider. °· Keep all follow-up visits as told by your health care provider. This is important. °Contact a health care provider if: °· You have a fever. °· Your pain gets worse or you have cramping. °· Your pain continues after resting. °Get help right away if: °· You are bleeding, leaking fluid, or passing tissue from the vagina. °· You have vomiting or diarrhea that does not go away. °· You have painful or bloody urination. °· You notice a decrease in your baby's movements. °· You feel very weak or faint. °· You have shortness of breath. °· You develop a severe headache with abdominal pain. °· You have abnormal vaginal discharge with abdominal pain. °This information is not intended to replace advice given to you by your health care provider. Make sure you discuss any questions you have with your health care provider. °Document Released: 05/18/2005 Document Revised: 02/27/2016 Document Reviewed: 12/15/2012 °Elsevier Interactive Patient Education © 2018 [employer] ° °Bacterial Vaginosis °Bacterial vaginosis is an infection of the vagina. It happens when too many germs (bacteria) grow in the vagina. This infection puts you at  risk for infections from sex (STIs). Treating this infection can lower your risk for some STIs. You should also treat this if you are pregnant. It can cause your baby to be born early. °Follow these instructions at home: °Medicines °· Take over-the-counter and prescription medicines only as told by your doctor. °· Take or use your antibiotic medicine as told by your doctor. Do not stop taking or using it even if you start to feel better. °General instructions °· If you your sexual partner is a woman, tell her that you have this infection. She needs to get treatment if she has symptoms. If you have a female partner, he does not need to be treated. °· During treatment: °? Avoid sex. °? Do not douche. °? Avoid alcohol as told. °? Avoid breastfeeding as told. °· Drink enough fluid to keep your pee (urine) clear or pale yellow. °· Keep your vagina and butt (rectum) clean. °? Wash the area with warm water every day. °? Wipe from front to back after you use the toilet. °· Keep all follow-up visits as told by your doctor. This is important. °Preventing this condition °· Do not douche. °· Use only warm water to wash around your vagina. °· Use protection when you have sex. This includes: °? Latex condoms. °? Dental dams. °· Limit how many people you have sex with. It is best to only have sex with the same person (be monogamous). °· Get tested for STIs. Have your partner get tested. °· Wear underwear that is cotton or lined with cotton. °· Avoid   tight pants and pantyhose. This is most important in summer. °· Do not use any products that have nicotine or tobacco in them. These include cigarettes and e-cigarettes. If you need help quitting, ask your doctor. °· Do not use illegal drugs. °· Limit how much alcohol you drink. °Contact a doctor if: °· Your symptoms do not get better, even after you are treated. °· You have more discharge or pain when you pee (urinate). °· You have a fever. °· You have pain in your belly  (abdomen). °· You have pain with sex. °· Your bleed from your vagina between periods. °Summary °· This infection happens when too many germs (bacteria) grow in the vagina. °· Treating this condition can lower your risk for some infections from sex (STIs). °· You should also treat this if you are pregnant. It can cause early (premature) birth. °· Do not stop taking or using your antibiotic medicine even if you start to feel better. °This information is not intended to replace advice given to you by your health care provider. Make sure you discuss any questions you have with your health care provider. °Document Released: 02/25/2008 Document Revised: 02/01/2016 Document Reviewed: 02/01/2016 °Elsevier Interactive Patient Education © 2017 Else[employer] Inc. ° °

## 2018-04-04 NOTE — MAU Note (Signed)
Is 5 w3d preg (health dept).  Just started having pain in RLQ.  Doesn't go back until Dec. No bleeding. First preg.

## 2018-04-04 NOTE — MAU Provider Note (Addendum)
History     CSN: [id number]  Arrival date and time: 04/04/18 1613   None     Chief Complaint  Patient presents with  . Abdominal Pain   HPI Jamie Ramos is 32 y.o. G1P0 [redacted]w[redacted]d weeks presenting with right lower quadrant pain that began today.  Describes as pressure that comes and goes.  Nothing makes it worse or better.  At it worse 5/10 same at this time.   Neg for bleeding.  Has appt in Dec 2 to begin prenatal care.  Has paper work with her from [facility] with + UPT.  Neg for abnormal vaginal discharge.    Past Medical History:  Diagnosis Date  . Medical history non-contributory     Past Surgical History:  Procedure Laterality Date  . WISDOM TOOTH EXTRACTION      No family history on file.  Social History   Tobacco Use  . Smoking status: Former Games developer  . Smokeless tobacco: Never Used  Substance Use Topics  . Alcohol use: Not Currently  . Drug use: No    Allergies: No Known Allergies  Medications Prior to Admission  Medication Sig Dispense Refill Last Dose  . erythromycin ophthalmic ointment Place a 1/2 inch ribbon of ointment into the lower eyelid x 1 week (Patient not taking: Reported on 08/16/2016) 3.5 g 0 Completed Course at Unknown time  . ibuprofen (ADVIL,MOTRIN) 600 MG tablet Take 1 tablet (600 mg total) by mouth every 6 (six) hours as needed. 30 tablet 0   . Phenylephrine-DM-GG-APAP (DELSYM COUGH/COLD DAYTIME) 5-10-200-325 MG/10ML LIQD Take 30 mLs by mouth every 6 (six) hours as needed (COUGH,COLD).   08/16/2016 at Unknown time    Review of Systems  Constitutional: Negative for activity change.  Respiratory: Negative for shortness of breath.   Gastrointestinal: Positive for abdominal pain (right lower quadrant paind).  Genitourinary: Negative for dysuria, hematuria, vaginal bleeding, vaginal discharge and vaginal pain.  Musculoskeletal: Negative for back pain.  Psychiatric/Behavioral: The patient is not nervous/anxious.    Physical Exam   Blood pressure  132/89, pulse 99, temperature 98.3 F (36.8 C), temperature source Oral, resp. rate 17, height 5\' 2"  (1.575 m), weight 48.8 kg, last menstrual period 02/25/2018, SpO2 100 %.  Physical Exam  Nursing note and vitals reviewed. Constitutional: She is oriented to person, place, and time. She appears well-developed and well-nourished. No distress.  HENT:  Head: Normocephalic.  Neck: Normal range of motion.  Cardiovascular: Normal rate.  Respiratory: Effort normal.  GI: Soft. She exhibits no distension and no mass. There is tenderness (mild discomfort on exam RLQ.  neg for palpable masses ). There is no rebound and no guarding.  Genitourinary: There is no rash, tenderness or lesion on the right labia. There is no rash, tenderness or lesion on the left labia. Uterus is not deviated, not enlarged and not tender. Cervix exhibits no motion tenderness, no discharge and no friability. Right adnexum displays tenderness (mild tenderness on exam without palpable masses). Right adnexum displays no mass and no fullness. Left adnexum displays no mass, no tenderness and no fullness. No erythema, tenderness or bleeding in the vagina. Vaginal discharge (small amount of white discharge without odor) found.  Neurological: She is alert and oriented to person, place, and time.  Skin: Skin is warm and dry.  Psychiatric: She has a normal mood and affect. Her behavior is normal. Thought content normal.    Results for orders placed or performed during the hospital encounter of 04/04/18 (from the past 24 hour(s))  Urinalysis, Routine w reflex microscopic     Status: Abnormal   Collection Time: 04/04/18  4:44 PM  Result Value Ref Range   Color, Urine YELLOW YELLOW   APPearance CLEAR CLEAR   Specific Gravity, Urine 1.011 1.005 - 1.030   pH 7.0 5.0 - 8.0   Glucose, UA NEGATIVE NEGATIVE mg/dL   Hgb urine dipstick NEGATIVE NEGATIVE   Bilirubin Urine NEGATIVE NEGATIVE   Ketones, ur 5 (A) NEGATIVE mg/dL   Protein, ur  NEGATIVE NEGATIVE mg/dL   Nitrite NEGATIVE NEGATIVE   Leukocytes, UA TRACE (A) NEGATIVE   RBC / HPF 0-5 0 - 5 RBC/hpf   WBC, UA 0-5 0 - 5 WBC/hpf   Bacteria, UA RARE (A) NONE SEEN   Squamous Epithelial / LPF 0-5 0 - 5  Wet prep, genital     Status: Abnormal   Collection Time: 04/04/18  5:52 PM  Result Value Ref Range   Yeast Wet Prep HPF POC NONE SEEN NONE SEEN   Trich, Wet Prep NONE SEEN NONE SEEN   Clue Cells Wet Prep HPF POC PRESENT (A) NONE SEEN   WBC, Wet Prep HPF POC MANY (A) NONE SEEN   Sperm NONE SEEN   hCG, quantitative, pregnancy     Status: Abnormal   Collection Time: 04/04/18  6:03 PM  Result Value Ref Range   hCG, Beta Chain, Quant, S 16,532 (H) <5 mIU/mL  CBC with Differential/Platelet     Status: Abnormal   Collection Time: 04/04/18  6:03 PM  Result Value Ref Range   WBC 15.7 (H) 4.0 - 10.5 K/uL   RBC 4.63 3.87 - 5.11 MIL/uL   Hemoglobin 15.6 (H) 12.0 - 15.0 g/dL   HCT 40.9 81.1 - 91.4 %   MCV 97.6 80.0 - 100.0 fL   MCH 33.7 26.0 - 34.0 pg   MCHC 34.5 30.0 - 36.0 g/dL   RDW 78.2 95.6 - 21.3 %   Platelets 301 150 - 400 K/uL   nRBC 0.0 0.0 - 0.2 %   Neutrophils Relative % 59 %   Neutro Abs 9.2 (H) 1.7 - 7.7 K/uL   Lymphocytes Relative 34 %   Lymphs Abs 5.3 (H) 0.7 - 4.0 K/uL   Monocytes Relative 5 %   Monocytes Absolute 0.8 0.1 - 1.0 K/uL   Eosinophils Relative 2 %   Eosinophils Absolute 0.4 0.0 - 0.5 K/uL   Basophils Relative 0 %   Basophils Absolute 0.0 0.0 - 0.1 K/uL  ABO/Rh     Status: None (Preliminary result)   Collection Time: 04/04/18  6:03 PM  Result Value Ref Range   ABO/RH(D)      O POS Performed at [facility], [address], [place], [place] [address]    PRELIM U/S report--Single IUGS with YS.  Measuring [redacted]w[redacted]d.  No Embryo or Cardiac Activity seen at this early gestation.  CLC on the right.  NO FF> MAU Course  Procedures  GC/CHL culture sent to lab.  HIV pending  MDM MSE Exam Labs U/S Reviewed lab and U/S results, causes of  cramping with Braxton.   Assessment and Plan  A:  Right lower quadrant pain in early pregnancy      [redacted]w[redacted]d gestational sac with YS noted on U/S      Bacterial vaiginosis  P: Rx for Flagyl to pharmacy      Keep appointment to begin prenatal care with [facility]      May take Tylenol prn for cramping.  Instructed to begin prenatal vitamins qd-can buy OTC              Eve M Ashiya Kinkead 04/04/2018, 7:39 PM

## 2018-04-05 LAB — GC/CHLAMYDIA PROBE AMP (~~LOC~~) NOT AT ARMC
Chlamydia: POSITIVE — AB
NEISSERIA GONORRHEA: NEGATIVE

## 2018-04-05 LAB — HIV ANTIBODY (ROUTINE TESTING W REFLEX): HIV Screen 4th Generation wRfx: NONREACTIVE

## 2018-04-07 ENCOUNTER — Telehealth: Payer: Self-pay | Admitting: Student

## 2018-04-07 DIAGNOSIS — A749 Chlamydial infection, unspecified: Secondary | ICD-10-CM

## 2018-04-07 MED ORDER — AZITHROMYCIN 500 MG PO TABS: 1000.0000 mg | ORAL_TABLET | Freq: Once | ORAL | 0 refills | Status: AC

## 2018-04-07 NOTE — Telephone Encounter (Addendum)
Jamie Ramos tested positive for  Chlamydia. Patient was called by RN and allergies and pharmacy confirmed. Rx sent to pharmacy of choice.   Judeth Horn, NP 04/07/2018 8:38 PM        ----- Message from Kathe Becton, RN sent at 04/07/2018  4:03 PM EST ----- This patient tested positive for :  Chlamydia  She"has NKDA",I have informed the patient of her results and confirmed her pharmacy is correct in her chart. Please send Rx.   Thank you,   Kathe Becton, RN   Results faxed to [facility] Department.

## 2018-04-11 ENCOUNTER — Encounter (HOSPITAL_COMMUNITY): Payer: Self-pay | Admitting: *Deleted

## 2018-04-11 ENCOUNTER — Inpatient Hospital Stay (HOSPITAL_COMMUNITY)
Admission: AD | Admit: 2018-04-11 | Discharge: 2018-04-11 | Disposition: A | Discharge status: Home / Self Care | Payer: Medicaid Other | Source: Ambulatory Visit | Attending: Obstetrics & Gynecology | Admitting: Obstetrics & Gynecology

## 2018-04-11 DIAGNOSIS — Z87891 Personal history of nicotine dependence: Secondary | ICD-10-CM | POA: Insufficient documentation

## 2018-04-11 DIAGNOSIS — N898 Other specified noninflammatory disorders of vagina: Secondary | ICD-10-CM | POA: Diagnosis present

## 2018-04-11 DIAGNOSIS — A749 Chlamydial infection, unspecified: Secondary | ICD-10-CM

## 2018-04-11 DIAGNOSIS — Z3A01 Less than 8 weeks gestation of pregnancy: Secondary | ICD-10-CM | POA: Diagnosis not present

## 2018-04-11 DIAGNOSIS — Z202 Contact with and (suspected) exposure to infections with a predominantly sexual mode of transmission: Secondary | ICD-10-CM | POA: Insufficient documentation

## 2018-04-11 DIAGNOSIS — O98811 Other maternal infectious and parasitic diseases complicating pregnancy, first trimester: Secondary | ICD-10-CM

## 2018-04-11 DIAGNOSIS — O26891 Other specified pregnancy related conditions, first trimester: Secondary | ICD-10-CM | POA: Insufficient documentation

## 2018-04-11 DIAGNOSIS — Z3401 Encounter for supervision of normal first pregnancy, first trimester: Secondary | ICD-10-CM

## 2018-04-11 MED ORDER — AZITHROMYCIN 250 MG PO TABS
1000.0000 mg | ORAL_TABLET | Freq: Once | ORAL | Status: AC
  Administered 2018-04-11: 1000 mg via ORAL
  Filled 2018-04-11: qty 4

## 2018-04-11 NOTE — MAU Note (Signed)
Patient was diagnosed with Chlamydia and states she was treated on Friday, but had sex on Saturday and has been having a white creamy discharge since taking the medicine.  She states she has had some vaginal burning and itching as well.

## 2018-04-11 NOTE — Discharge Instructions (Signed)
Chlamydia, Female Chlamydia is an STD (sexually transmitted disease). This is an infection that spreads through sexual contact. If it is not treated, it can cause serious problems. It must be treated with antibiotic medicine. Sometimes, you may not have symptoms (asymptomatic). When you have symptoms, they can include:  Burning when you pee (urinate).  Peeing often.  Fluid (discharge) coming from the vagina.  Redness, soreness, and swelling (inflammation) of the butt (rectum).  Bleeding or fluid coming from the butt.  Belly (abdominal) pain.  Pain during sex.  Bleeding between periods.  Itching, burning, or redness in the eyes.  Fluid coming from the eyes.  Follow these instructions at home: Medicines  Take over-the-counter and prescription medicines only as told by your doctor.  Take your antibiotic medicine as told by your doctor. Do not stop taking the antibiotic even if you start to feel better. Sexual activity  Tell sex partners about your infection. Sex partners are people you had oral, anal, or vaginal sex with within 60 days of when you started getting sick. They need treatment, too.  Do not have sex until: ? You and your sex partners have been treated. ? Your doctor says it is okay.  If you have a single dose treatment, wait 7 days before having sex. General instructions  It is up to you to get your test results. Ask your doctor when your results will be ready.  Get a lot of rest.  Eat healthy foods.  Drink enough fluid to keep your pee (urine) clear or pale yellow.  Keep all follow-up visits as told by your doctor. You may need tests after 3 months. Preventing chlamydia  The only way to prevent chlamydia is not to have sex. To lower your risk: ? Use latex condoms correctly. Do this every time you have sex. ? Avoid having many sex partners. ? Ask if your partner has been tested for STDs and if he or she had negative results. Contact a doctor if:  You  get new symptoms.  You do not get better with treatment.  You have a fever or chills.  You have pain during sex. Get help right away if:  Your pain gets worse and does not get better with medicine.  You get flu-like symptoms, such as: ? Night sweats. ? Sore throat. ? Muscle aches.  You feel sick to your stomach (nauseous).  You throw up (vomit).  You have trouble swallowing.  You have bleeding: ? Between periods. ? After sex.  You have irregular periods.  You have belly pain that does not get better with medicine.  You have lower back pain that does not get better with medicine.  You feel weak or dizzy.  You pass out (faint).  You are pregnant and you get symptoms of chlamydia. Summary  Chlamydia is an infection that spreads through sexual contact.  Sometimes, chlamydia can cause no symptoms (asymptomatic).  Do not have sex until your doctor says it is okay.  All sex partners will have to be treated for chlamydia. This information is not intended to replace advice given to you by your health care provider. Make sure you discuss any questions you have with your health care provider. Document Released: 02/25/2008 Document Revised: 05/07/2016 Document Reviewed: 05/07/2016 Elsevier Interactive Patient Education  2017 Elsevier Inc.  

## 2018-04-11 NOTE — MAU Note (Signed)
Urine in lab 

## 2018-04-11 NOTE — MAU Provider Note (Signed)
History     CSN: [id number]  Arrival date and time: 04/11/18 1505   First Provider Initiated Contact with Patient 04/11/18 1539      Chief Complaint  Patient presents with  . Vaginal Discharge  . Vaginal Itching  . Abdominal Pain   HPI  Jamie Ramos is a 32 y.o. G1P0 at [redacted]w[redacted]d who presents to MAU with chief complaint of thick white vaginal discharge. Patient was diagnosed with Bacterial Vaginosis and Chlamydia last week. States she started her medication Friday 04/08/18 but vomited immediately after taking her Azithromycin. She then had unprotected penetrative sex on Saturday with the same partner who infected her. Denies abdominal cramping, heavy vaginal bleeding, fever, or recent illness.   OB History    Gravida  1   Para      Term      Preterm      AB      Living        SAB      TAB      Ectopic      Multiple      Live Births              Past Medical History:  Diagnosis Date  . Medical history non-contributory     Past Surgical History:  Procedure Laterality Date  . WISDOM TOOTH EXTRACTION      History reviewed. No pertinent family history.  Social History   Tobacco Use  . Smoking status: Former Games developer  . Smokeless tobacco: Never Used  Substance Use Topics  . Alcohol use: Not Currently  . Drug use: No    Allergies: No Known Allergies  Medications Prior to Admission  Medication Sig Dispense Refill Last Dose  . metroNIDAZOLE (FLAGYL) 500 MG tablet Take 1 tablet (500 mg total) by mouth 2 (two) times daily for 7 days. 14 tablet 0   . Phenylephrine-DM-GG-APAP (DELSYM COUGH/COLD DAYTIME) 5-10-200-325 MG/10ML LIQD Take 30 mLs by mouth every 6 (six) hours as needed (COUGH,COLD).   08/16/2016 at Unknown time    Review of Systems  Constitutional: Negative for fever.  Gastrointestinal: Negative for abdominal pain, nausea and vomiting.  Genitourinary: Positive for vaginal discharge. Negative for difficulty urinating, vaginal bleeding and  vaginal pain.  All other systems reviewed and are negative.  Physical Exam   Blood pressure 128/77, pulse 84, temperature 98.2 F (36.8 C), temperature source Oral, resp. rate 18, weight 50 kg, last menstrual period 02/25/2018, SpO2 100 %.  Physical Exam  Nursing note and vitals reviewed. Constitutional: She is oriented to person, place, and time. She appears well-developed and well-nourished.  Cardiovascular: Normal rate, normal heart sounds and intact distal pulses.  Respiratory: Effort normal and breath sounds normal.  GI: Soft. She exhibits no distension. There is no tenderness. There is no rebound, no guarding and no CVA tenderness.  Genitourinary: Vaginal discharge found.  Neurological: She is alert and oriented to person, place, and time.  Skin: Skin is warm and dry.  Psychiatric: She has a normal mood and affect. Her behavior is normal. Judgment and thought content normal.    MAU Course  Procedures  MDM  --Rexposure to Chlamydia after possible compromised treatment due to vomiting --Discussed medication administration with patient. Given paper script for expedited partner treatment. Underscored importance of simultaneous treatment. Condoms reduce risk but are not 100% effective at preventing additional episodes.  Patient Vitals for the past 24 hrs:  BP Temp Temp src Pulse Resp SpO2 Weight  04/11/18 1533 128/77 98.2 F (  36.8 C) Oral 84 18 100 % -  04/11/18 1522 - - - - - - 50 kg    Meds ordered this encounter  Medications  . azithromycin (ZITHROMAX) tablet 1,000 mg    Assessment and Plan  --32 y.o. G1P0 at [redacted]w[redacted]d  --Chlamydia exposure with probable ineffective treatment due to vomiting after initial med administration --Retreated in MAU today, given paper script for expedited partner treatment --Discharge home in stable condition  Calvert Cantor, CNM 04/11/2018, 4:53 PM

## 2018-04-16 ENCOUNTER — Encounter (HOSPITAL_COMMUNITY): Payer: Self-pay

## 2018-04-16 ENCOUNTER — Other Ambulatory Visit: Payer: Self-pay

## 2018-04-16 ENCOUNTER — Inpatient Hospital Stay (HOSPITAL_COMMUNITY)
Admission: AD | Admit: 2018-04-16 | Discharge: 2018-04-16 | Disposition: A | Discharge status: Home / Self Care | Payer: Medicaid Other | Source: Ambulatory Visit | Attending: Obstetrics & Gynecology | Admitting: Obstetrics & Gynecology

## 2018-04-16 DIAGNOSIS — B379 Candidiasis, unspecified: Secondary | ICD-10-CM | POA: Diagnosis not present

## 2018-04-16 DIAGNOSIS — Z3A01 Less than 8 weeks gestation of pregnancy: Secondary | ICD-10-CM

## 2018-04-16 DIAGNOSIS — Z87891 Personal history of nicotine dependence: Secondary | ICD-10-CM | POA: Diagnosis not present

## 2018-04-16 DIAGNOSIS — A749 Chlamydial infection, unspecified: Secondary | ICD-10-CM

## 2018-04-16 DIAGNOSIS — N898 Other specified noninflammatory disorders of vagina: Secondary | ICD-10-CM | POA: Diagnosis present

## 2018-04-16 DIAGNOSIS — O26891 Other specified pregnancy related conditions, first trimester: Secondary | ICD-10-CM

## 2018-04-16 DIAGNOSIS — O98811 Other maternal infectious and parasitic diseases complicating pregnancy, first trimester: Secondary | ICD-10-CM | POA: Diagnosis not present

## 2018-04-16 HISTORY — DX: Chlamydial infection, unspecified: A74.9

## 2018-04-16 LAB — URINALYSIS, ROUTINE W REFLEX MICROSCOPIC
Bilirubin Urine: NEGATIVE
Glucose, UA: NEGATIVE mg/dL
Hgb urine dipstick: NEGATIVE
Ketones, ur: NEGATIVE mg/dL
Nitrite: NEGATIVE
Protein, ur: NEGATIVE mg/dL
Specific Gravity, Urine: 1.013 (ref 1.005–1.030)
pH: 7 (ref 5.0–8.0)

## 2018-04-16 LAB — WET PREP, GENITAL
Clue Cells Wet Prep HPF POC: NONE SEEN
Sperm: NONE SEEN
TRICH WET PREP: NONE SEEN

## 2018-04-16 MED ORDER — TERCONAZOLE 0.4 % VA CREA
1.0000 | TOPICAL_CREAM | Freq: Every day | VAGINAL | 0 refills | Status: DC
Start: 2018-04-16 — End: 2018-06-04

## 2018-04-16 MED ORDER — AZITHROMYCIN 500 MG PO TABS: 1000.0000 mg | ORAL_TABLET | Freq: Once | ORAL | 1 refills | Status: AC

## 2018-04-16 NOTE — MAU Note (Signed)
Jamie FloodGlenda Helbling is a 32 y.o. at 5072w1d here in MAU reporting: +vaginal discharge/irritation/burning States was seen last Monday and was given medication for chlamydia. On the way home she reports throwing up and seeing the meds come back up. Called the on call and they told her to come back in. Pain score: denies Vitals:   04/16/18 1552  BP: 132/76  Pulse: 94  Resp: 18  Temp: 98.4 F (36.9 C)  SpO2: 100%      Lab orders placed from triage: ua

## 2018-04-16 NOTE — MAU Provider Note (Signed)
History     CSN: [id number]  Arrival date and time: 04/16/18 1525   First Provider Initiated Contact with Patient 04/16/18 1628      Chief Complaint  Patient presents with  . Vaginal Discharge   HPI  Jamie Ramos is a 32 y.o. G1P0 at [redacted]w[redacted]d who presents to MAU with concern for thick white vaginal discharge in the setting of Chlamydia diagnosis. Patient is also concerned that she has been treated for Chlamydia twice and both times has vomited within a few hours of medication administration. Patient states she vomited shortly after being discharge from MAU 04/11/18 and believes she saw her Azithromycin treatment in her emesis.   Patient states she is having difficulty with medication because she is taking them on "less than a big dinner meal". Patient is requesting rx to her pharmacy so she may have a full meal prior to administration.  OB History    Gravida  1   Para      Term      Preterm      AB      Living        SAB      TAB      Ectopic      Multiple      Live Births              Past Medical History:  Diagnosis Date  . Chlamydia 2019  . Medical history non-contributory     Past Surgical History:  Procedure Laterality Date  . WISDOM TOOTH EXTRACTION      History reviewed. No pertinent family history.  Social History   Tobacco Use  . Smoking status: Former Games developer  . Smokeless tobacco: Never Used  Substance Use Topics  . Alcohol use: Not Currently  . Drug use: No    Allergies: No Known Allergies  Medications Prior to Admission  Medication Sig Dispense Refill Last Dose  . Phenylephrine-DM-GG-APAP (DELSYM COUGH/COLD DAYTIME) 5-10-200-325 MG/10ML LIQD Take 30 mLs by mouth every 6 (six) hours as needed (COUGH,COLD).   08/16/2016 at Unknown time    Review of Systems  Constitutional: Negative for fever.  Gastrointestinal: Negative for abdominal pain, diarrhea, nausea and vomiting.  Genitourinary: Positive for vaginal discharge.   Neurological: Negative for headaches.  All other systems reviewed and are negative.  Physical Exam   Blood pressure 132/76, pulse 94, temperature 98.4 F (36.9 C), temperature source Oral, resp. rate 18, weight 50 kg, last menstrual period 02/25/2018, SpO2 100 %.  Physical Exam  Nursing note and vitals reviewed. Constitutional: She is oriented to person, place, and time. She appears well-developed and well-nourished.  Respiratory: Effort normal.  GI: Soft. She exhibits no distension. There is no tenderness. There is no rebound and no guarding.  Genitourinary: Vaginal discharge found.  Genitourinary Comments: Thick white vaginal discharge noted on swab collection  Musculoskeletal: Normal range of motion.  Neurological: She is alert and oriented to person, place, and time.  Skin: Skin is warm and dry.  Psychiatric: She has a normal mood and affect. Her behavior is normal. Judgment and thought content normal.    MAU Course/MDM   --Discussed CDC-approved treatment of Doxycycline instead of Azithromycin. Patient declines --Reviewed need for condoms for two weeks after treatment.  --Large leukocytes, no elevation of WBCs. Urine culture ordered  Patient Vitals for the past 24 hrs:  BP Temp Temp src Pulse Resp SpO2 Weight  04/16/18 1744 122/73 - - 82 16 - -  04/16/18 1552 132/76  98.4 F (36.9 C) Oral 94 18 100 % 50 kg    Results for orders placed or performed during the hospital encounter of 04/16/18 (from the past 24 hour(s))  Urinalysis, Routine w reflex microscopic     Status: Abnormal   Collection Time: 04/16/18  4:06 PM  Result Value Ref Range   Color, Urine YELLOW YELLOW   APPearance CLOUDY (A) CLEAR   Specific Gravity, Urine 1.013 1.005 - 1.030   pH 7.0 5.0 - 8.0   Glucose, UA NEGATIVE NEGATIVE mg/dL   Hgb urine dipstick NEGATIVE NEGATIVE   Bilirubin Urine NEGATIVE NEGATIVE   Ketones, ur NEGATIVE NEGATIVE mg/dL   Protein, ur NEGATIVE NEGATIVE mg/dL   Nitrite NEGATIVE  NEGATIVE   Leukocytes, UA LARGE (A) NEGATIVE   RBC / HPF 0-5 0 - 5 RBC/hpf   WBC, UA 0-5 0 - 5 WBC/hpf   Bacteria, UA RARE (A) NONE SEEN   Squamous Epithelial / LPF 6-10 0 - 5   Mucus PRESENT   Wet prep, genital     Status: Abnormal   Collection Time: 04/16/18  4:47 PM  Result Value Ref Range   Yeast Wet Prep HPF POC PRESENT (A) NONE SEEN   Trich, Wet Prep NONE SEEN NONE SEEN   Clue Cells Wet Prep HPF POC NONE SEEN NONE SEEN   WBC, Wet Prep HPF POC MODERATE (A) NONE SEEN   Sperm NONE SEEN     Meds ordered this encounter  Medications  . azithromycin (ZITHROMAX) 500 MG tablet    Sig: Take 2 tablets (1,000 mg total) by mouth once for 1 dose.    Dispense:  2 tablet    Refill:  1    Order Specific Question:   Supervising Provider    Answer:   Reva BoresPRATT, TANYA S [[id number]]  . terconazole (TERAZOL 7) 0.4 % vaginal cream    Sig: Place 1 applicator vaginally at bedtime. Use for seven days    Dispense:  45 g    Refill:  0    Order Specific Question:   Supervising Provider    Answer:   Reva BoresPRATT, TANYA S [[id number]]    Assessment and Plan  --32 y.o. G1P0 at 5258w1d  --Yeast infection, Chlamydia diagnosis with emesis after treatment. Rx to patient pharmacy --Will follow up on results of urine culture PRN --Discharge home in stable condition  Calvert CantorSamantha C Aj Crunkleton , CNM 04/16/2018, 5:42 PM

## 2018-04-16 NOTE — Discharge Instructions (Signed)
Chlamydia, Female Chlamydia is an STD (sexually transmitted disease). This is an infection that spreads through sexual contact. If it is not treated, it can cause serious problems. It must be treated with antibiotic medicine. Sometimes, you may not have symptoms (asymptomatic). When you have symptoms, they can include:  Burning when you pee (urinate).  Peeing often.  Fluid (discharge) coming from the vagina.  Redness, soreness, and swelling (inflammation) of the butt (rectum).  Bleeding or fluid coming from the butt.  Belly (abdominal) pain.  Pain during sex.  Bleeding between periods.  Itching, burning, or redness in the eyes.  Fluid coming from the eyes.  Follow these instructions at home: Medicines  Take over-the-counter and prescription medicines only as told by your doctor.  Take your antibiotic medicine as told by your doctor. Do not stop taking the antibiotic even if you start to feel better. Sexual activity  Tell sex partners about your infection. Sex partners are people you had oral, anal, or vaginal sex with within 60 days of when you started getting sick. They need treatment, too.  Do not have sex until: ? You and your sex partners have been treated. ? Your doctor says it is okay.  If you have a single dose treatment, wait 7 days before having sex. General instructions  It is up to you to get your test results. Ask your doctor when your results will be ready.  Get a lot of rest.  Eat healthy foods.  Drink enough fluid to keep your pee (urine) clear or pale yellow.  Keep all follow-up visits as told by your doctor. You may need tests after 3 months. Preventing chlamydia  The only way to prevent chlamydia is not to have sex. To lower your risk: ? Use latex condoms correctly. Do this every time you have sex. ? Avoid having many sex partners. ? Ask if your partner has been tested for STDs and if he or she had negative results. Contact a doctor if:  You  get new symptoms.  You do not get better with treatment.  You have a fever or chills.  You have pain during sex. Get help right away if:  Your pain gets worse and does not get better with medicine.  You get flu-like symptoms, such as: ? Night sweats. ? Sore throat. ? Muscle aches.  You feel sick to your stomach (nauseous).  You throw up (vomit).  You have trouble swallowing.  You have bleeding: ? Between periods. ? After sex.  You have irregular periods.  You have belly pain that does not get better with medicine.  You have lower back pain that does not get better with medicine.  You feel weak or dizzy.  You pass out (faint).  You are pregnant and you get symptoms of chlamydia. Summary  Chlamydia is an infection that spreads through sexual contact.  Sometimes, chlamydia can cause no symptoms (asymptomatic).  Do not have sex until your doctor says it is okay.  All sex partners will have to be treated for chlamydia. This information is not intended to replace advice given to you by your health care provider. Make sure you discuss any questions you have with your health care provider. Document Released: 02/25/2008 Document Revised: 05/07/2016 Document Reviewed: 05/07/2016 Elsevier Interactive Patient Education  2017 Elsevier Inc.  

## 2018-04-17 LAB — CULTURE, OB URINE: Culture: NO GROWTH

## 2018-05-10 ENCOUNTER — Other Ambulatory Visit (HOSPITAL_COMMUNITY): Payer: Self-pay | Admitting: Family

## 2018-05-10 DIAGNOSIS — Z369 Encounter for antenatal screening, unspecified: Secondary | ICD-10-CM

## 2018-05-23 ENCOUNTER — Encounter (HOSPITAL_COMMUNITY): Payer: Self-pay

## 2018-05-30 ENCOUNTER — Ambulatory Visit (HOSPITAL_COMMUNITY): Admission: RE | Admit: 2018-05-30 | Payer: Medicaid Other | Source: Ambulatory Visit

## 2018-05-30 ENCOUNTER — Encounter (HOSPITAL_COMMUNITY): Payer: Self-pay

## 2018-05-30 ENCOUNTER — Ambulatory Visit (HOSPITAL_COMMUNITY): Payer: Medicaid Other | Attending: Family

## 2018-06-01 NOTE — L&D Delivery Note (Signed)
Delivery Note Patient pushed for 15 minutes.  At 9:21 PM a viable female was delivered via Vaginal, Spontaneous (Presentation: ROA).  APGAR: 1, 8; weight 3230 gm (7lb 1.9oz).   Placenta status: Spontaneous, in tact.  Cord:3V  with the following complications: None .  Cord pH: collected and pending  Immediately following delivery, baby was apneic.  Warm dry stimulation was performed and a Code Apgar was called.  Baby received PPV with improvement in HR and suctioning. He was transitioned to skin to skin with mom, but was noted to drop have intermittent drops in his O2 saturations.  He was transferred to couplet care in NICU for observation.   Anesthesia:  Epidural Episiotomy: None Lacerations: 1st degree;Periurethral Suture Repair: 3.0 vicryl rapide Est. Blood Loss (mL):  836 mL.  Following delivery, there were two separate lacerations , both with rapid arterial bleeding.  Following repair, bleeding was minimal and uterine tone was excellent  Mom to couplet care.  Baby to NICU.  [place] 12/04/2018, 10:20 PM

## 2018-06-04 ENCOUNTER — Inpatient Hospital Stay (HOSPITAL_COMMUNITY)
Admission: AD | Admit: 2018-06-04 | Discharge: 2018-06-04 | Disposition: A | Discharge status: Home / Self Care | Payer: Medicaid Other | Source: Ambulatory Visit | Attending: Obstetrics & Gynecology | Admitting: Obstetrics & Gynecology

## 2018-06-04 ENCOUNTER — Encounter (HOSPITAL_COMMUNITY): Payer: Self-pay | Admitting: *Deleted

## 2018-06-04 DIAGNOSIS — O26892 Other specified pregnancy related conditions, second trimester: Secondary | ICD-10-CM | POA: Insufficient documentation

## 2018-06-04 DIAGNOSIS — Z87891 Personal history of nicotine dependence: Secondary | ICD-10-CM | POA: Diagnosis not present

## 2018-06-04 DIAGNOSIS — R109 Unspecified abdominal pain: Secondary | ICD-10-CM

## 2018-06-04 DIAGNOSIS — Z3A14 14 weeks gestation of pregnancy: Secondary | ICD-10-CM | POA: Insufficient documentation

## 2018-06-04 DIAGNOSIS — R1030 Lower abdominal pain, unspecified: Secondary | ICD-10-CM | POA: Insufficient documentation

## 2018-06-04 DIAGNOSIS — Z3492 Encounter for supervision of normal pregnancy, unspecified, second trimester: Secondary | ICD-10-CM

## 2018-06-04 LAB — URINALYSIS, ROUTINE W REFLEX MICROSCOPIC
Bilirubin Urine: NEGATIVE
Glucose, UA: NEGATIVE mg/dL
Hgb urine dipstick: NEGATIVE
Ketones, ur: NEGATIVE mg/dL
Leukocytes, UA: NEGATIVE
Nitrite: NEGATIVE
PROTEIN: NEGATIVE mg/dL
SPECIFIC GRAVITY, URINE: 1.017 (ref 1.005–1.030)
pH: 8 (ref 5.0–8.0)

## 2018-06-04 NOTE — Discharge Instructions (Signed)
Abdominal Pain During Pregnancy ° °Abdominal pain is common during pregnancy, and has many possible causes. Some causes are more serious than others, and sometimes the cause is not known. Abdominal pain can be a sign that labor is starting. It can also be caused by normal growth and stretching of muscles and ligaments during pregnancy. Always tell your health care provider if you have any abdominal pain. °Follow these instructions at home: °· Do not have sex or put anything in your vagina until your pain goes away completely. °· Get plenty of rest until your pain improves. °· Drink enough fluid to keep your urine pale yellow. °· Take over-the-counter and prescription medicines only as told by your health care provider. °· Keep all follow-up visits as told by your health care provider. This is important. °Contact a health care provider if: °· Your pain continues or gets worse after resting. °· You have lower abdominal pain that: °? Comes and goes at regular intervals. °? Spreads to your back. °? Is similar to menstrual cramps. °· You have pain or burning when you urinate. °Get help right away if: °· You have a fever or chills. °· You have vaginal bleeding. °· You are leaking fluid from your vagina. °· You are passing tissue from your vagina. °· You have vomiting or diarrhea that lasts for more than 24 hours. °· Your baby is moving less than usual. °· You feel very weak or faint. °· You have shortness of breath. °· You develop severe pain in your upper abdomen. °Summary °· Abdominal pain is common during pregnancy, and has many possible causes. °· If you experience abdominal pain during pregnancy, tell your health care provider right away. °· Follow your health care provider's home care instructions and keep all follow-up visits as directed. °This information is not intended to replace advice given to you by your health care provider. Make sure you discuss any questions you have with your health care  provider. °Document Released: 05/18/2005 Document Revised: 08/20/2016 Document Reviewed: 08/20/2016 °Elsevier Interactive Patient Education © 2019 Elsevier Inc. ° °

## 2018-06-04 NOTE — MAU Provider Note (Signed)
Chief Complaint: Abdominal Pain and Emesis   First Provider Initiated Contact with Patient 06/04/18 1628     SUBJECTIVE HPI: Jamie Ramos is a 33 y.o. G1P0 at [redacted]w[redacted]d who presents to Maternity Admissions reporting abdominal pain. Symptoms began a few days ago. Reports intermittent pain in her lower abdomen and LLQ. States she vomited a few days ago after eating cereal. No nausea or vomiting since then. Denies dysuria, vaginal bleeding, diarrhea, constipation, or vaginal discharge.  Was supposed to have her nuchal translucency ultrasound on Monday but missed it d/t being stuck out of town. States she's worried because she missed the ultrasound and just "wants to hear the baby's heartbeat".   Location: abdomen Quality: sharp Severity: 8/10 on pain scale Duration: 3 days Timing: intermittent Modifying factors: none Associated signs and symptoms: none  Past Medical History:  Diagnosis Date  . Chlamydia 2019  . Medical history non-contributory    OB History  Gravida Para Term Preterm AB Living  1            SAB TAB Ectopic Multiple Live Births               # Outcome Date GA Lbr Len/2nd Weight Sex Delivery Anes PTL Lv  1 Current            Past Surgical History:  Procedure Laterality Date  . WISDOM TOOTH EXTRACTION     Social History   Socioeconomic History  . Marital status: Single    Spouse name: Not on file  . Number of children: Not on file  . Years of education: Not on file  . Highest education level: Not on file  Occupational History  . Not on file  Social Needs  . Financial resource strain: Not on file  . Food insecurity:    Worry: Not on file    Inability: Not on file  . Transportation needs:    Medical: Not on file    Non-medical: Not on file  Tobacco Use  . Smoking status: Former Games developer  . Smokeless tobacco: Never Used  Substance and Sexual Activity  . Alcohol use: Not Currently  . Drug use: No  . Sexual activity: Yes    Birth control/protection: None   Lifestyle  . Physical activity:    Days per week: Not on file    Minutes per session: Not on file  . Stress: Not on file  Relationships  . Social connections:    Talks on phone: Not on file    Gets together: Not on file    Attends religious service: Not on file    Active member of club or organization: Not on file    Attends meetings of clubs or organizations: Not on file    Relationship status: Not on file  . Intimate partner violence:    Fear of current or ex partner: Not on file    Emotionally abused: Not on file    Physically abused: Not on file    Forced sexual activity: Not on file  Other Topics Concern  . Not on file  Social History Narrative  . Not on file   History reviewed. No pertinent family history. No current facility-administered medications on file prior to encounter.    Current Outpatient Medications on File Prior to Encounter  Medication Sig Dispense Refill  . Phenylephrine-DM-GG-APAP (DELSYM COUGH/COLD DAYTIME) 5-10-200-325 MG/10ML LIQD Take 30 mLs by mouth every 6 (six) hours as needed (COUGH,COLD).     No Known Allergies  I  have reviewed patient's Past Medical Hx, Surgical Hx, Family Hx, Social Hx, medications and allergies.   Review of Systems  Constitutional: Negative.   Gastrointestinal: Positive for abdominal pain.  Genitourinary: Negative.     OBJECTIVE Patient Vitals for the past 24 hrs:  BP Temp Temp src Pulse Resp SpO2 Weight  06/04/18 1639 129/72 - - 99 - - -  06/04/18 1522 112/81 98.5 F (36.9 C) Oral 98 17 100 % 48.6 kg   Constitutional: Well-developed, well-nourished female in no acute distress.  Cardiovascular: normal rate & rhythm, no murmur Respiratory: normal rate and effort. Lung sounds clear throughout GI: Abd soft, non-tender, Pos BS x 4. No guarding or rebound tenderness MS: Extremities nontender, no edema, normal ROM Neurologic: Alert and oriented x 4.  GU: cervix closed/thick   LAB RESULTS Results for orders placed  or performed during the hospital encounter of 06/04/18 (from the past 24 hour(s))  Urinalysis, Routine w reflex microscopic     Status: Abnormal   Collection Time: 06/04/18  3:29 PM  Result Value Ref Range   Color, Urine AMBER (A) YELLOW   APPearance TURBID (A) CLEAR   Specific Gravity, Urine 1.017 1.005 - 1.030   pH 8.0 5.0 - 8.0   Glucose, UA NEGATIVE NEGATIVE mg/dL   Hgb urine dipstick NEGATIVE NEGATIVE   Bilirubin Urine NEGATIVE NEGATIVE   Ketones, ur NEGATIVE NEGATIVE mg/dL   Protein, ur NEGATIVE NEGATIVE mg/dL   Nitrite NEGATIVE NEGATIVE   Leukocytes, UA NEGATIVE NEGATIVE   RBC / HPF 0-5 0 - 5 RBC/hpf   Bacteria, UA RARE (A) NONE SEEN   Squamous Epithelial / LPF 0-5 0 - 5   Mucus PRESENT    Amorphous Crystal PRESENT     IMAGING No results found.  MAU COURSE Orders Placed This Encounter  Procedures  . Culture, OB Urine  . Urinalysis, Routine w reflex microscopic  . Discharge patient   No orders of the defined types were placed in this encounter.   MDM FHR present via doppler Benign abdominal exam Cervix closed/thick Pt reassured by exam & hearing FHT  ASSESSMENT 1. Abdominal pain during pregnancy in second trimester   2. [redacted] weeks gestation of pregnancy   3. Fetal heart rate present, second trimester     PLAN Discharge home in stable condition. Discussed reasons to return to MAU  Follow-up Information    [facility]   Contact information: [address] [place] [id number] [phone]          Allergies as of 06/04/2018   No Known Allergies     Medication List    STOP taking these medications   terconazole 0.4 % vaginal cream Commonly known as:  TERAZOL 7     TAKE these medications   DELSYM COUGH/COLD DAYTIME 5-10-200-325 MG/10ML Liqd Generic drug:  Phenylephrine-DM-GG-APAP Take 30 mLs by mouth every 6 (six) hours as needed (COUGH,COLD).        Judeth HornLawrence, Kyshon Tolliver, NP 06/04/2018  8:07 PM
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
## 2018-06-06 LAB — CULTURE, OB URINE: Culture: 80000 — AB

## 2018-08-18 ENCOUNTER — Emergency Department (HOSPITAL_COMMUNITY): Payer: BLUE CROSS/BLUE SHIELD

## 2018-08-18 ENCOUNTER — Encounter (HOSPITAL_COMMUNITY): Payer: Self-pay | Admitting: Emergency Medicine

## 2018-08-18 ENCOUNTER — Inpatient Hospital Stay (HOSPITAL_COMMUNITY)
Admission: EM | Admit: 2018-08-18 | Discharge: 2018-08-19 | Disposition: A | Discharge status: Other Acute Inpatient Hospital | Payer: BLUE CROSS/BLUE SHIELD | Attending: Obstetrics and Gynecology | Admitting: Obstetrics and Gynecology

## 2018-08-18 DIAGNOSIS — O9A212 Injury, poisoning and certain other consequences of external causes complicating pregnancy, second trimester: Secondary | ICD-10-CM | POA: Diagnosis present

## 2018-08-18 DIAGNOSIS — Y92410 Unspecified street and highway as the place of occurrence of the external cause: Secondary | ICD-10-CM | POA: Diagnosis not present

## 2018-08-18 DIAGNOSIS — Y998 Other external cause status: Secondary | ICD-10-CM | POA: Insufficient documentation

## 2018-08-18 DIAGNOSIS — O219 Vomiting of pregnancy, unspecified: Secondary | ICD-10-CM | POA: Diagnosis not present

## 2018-08-18 DIAGNOSIS — Z3A24 24 weeks gestation of pregnancy: Secondary | ICD-10-CM

## 2018-08-18 DIAGNOSIS — Z87891 Personal history of nicotine dependence: Secondary | ICD-10-CM | POA: Insufficient documentation

## 2018-08-18 DIAGNOSIS — S8002XA Contusion of left knee, initial encounter: Secondary | ICD-10-CM

## 2018-08-18 DIAGNOSIS — Y9389 Activity, other specified: Secondary | ICD-10-CM | POA: Diagnosis not present

## 2018-08-18 MED ORDER — ONDANSETRON 4 MG PO TBDP
4.0000 mg | ORAL_TABLET | Freq: Once | ORAL | Status: AC
  Administered 2018-08-18: 4 mg via ORAL
  Filled 2018-08-18: qty 1
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
## 2018-08-18 NOTE — Progress Notes (Signed)
Transport team never arrived for pt so rrob to transfer to mau.  Report given to Caprice Renshaw in MAU
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
## 2018-08-18 NOTE — H&P (Signed)
Bobby,ed rn reports pts knee xray was clear.  Dr Ranae Palms notified of this and says pt is cleared to go to [facility] for further fhr evaluation.
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
## 2018-08-18 NOTE — ED Notes (Signed)
Erin RN ( rapid response nurse) reported pt.'s status/transfer plan to nurse at [facility] MAU.

## 2018-08-18 NOTE — ED Notes (Signed)
Patient transported to [facility] MAU in stable condition with rapid response nurse .
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
## 2018-08-18 NOTE — ED Notes (Signed)
Patient transported to X-ray 

## 2018-08-18 NOTE — ED Notes (Addendum)
Assumed care on pt. , pt. resting with no distress, respirations unlabored , rapid OB nurse at bedside , fetal monitoring continues , no vaginal discharge or bleeding , mild left knee discomfort .
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
## 2018-08-19 MED ORDER — ONDANSETRON 4 MG PO TBDP: 4.0000 mg | ORAL_TABLET | Freq: Four times a day (QID) | ORAL | 0 refills | Status: DC | PRN

## 2018-08-19 NOTE — Discharge Instructions (Signed)

## 2018-11-09 LAB — OB RESULTS CONSOLE GBS: GBS: POSITIVE

## 2018-12-04 ENCOUNTER — Encounter (HOSPITAL_COMMUNITY): Payer: Self-pay

## 2018-12-04 ENCOUNTER — Inpatient Hospital Stay (HOSPITAL_COMMUNITY): Payer: Medicaid Other | Admitting: Anesthesiology

## 2018-12-04 ENCOUNTER — Inpatient Hospital Stay (HOSPITAL_COMMUNITY)
Admission: AD | Admit: 2018-12-04 | Discharge: 2018-12-06 | DRG: 807 | Disposition: A | Discharge status: Home / Self Care | Payer: Medicaid Other | Attending: Obstetrics | Admitting: Obstetrics

## 2018-12-04 ENCOUNTER — Other Ambulatory Visit: Payer: Self-pay

## 2018-12-04 DIAGNOSIS — O4693 Antepartum hemorrhage, unspecified, third trimester: Secondary | ICD-10-CM | POA: Diagnosis present

## 2018-12-04 DIAGNOSIS — Z3A40 40 weeks gestation of pregnancy: Secondary | ICD-10-CM | POA: Diagnosis not present

## 2018-12-04 DIAGNOSIS — Z1159 Encounter for screening for other viral diseases: Secondary | ICD-10-CM

## 2018-12-04 DIAGNOSIS — O99824 Streptococcus B carrier state complicating childbirth: Principal | ICD-10-CM | POA: Diagnosis present

## 2018-12-04 DIAGNOSIS — Z87891 Personal history of nicotine dependence: Secondary | ICD-10-CM

## 2018-12-04 DIAGNOSIS — O26893 Other specified pregnancy related conditions, third trimester: Secondary | ICD-10-CM | POA: Diagnosis present

## 2018-12-04 LAB — CBC
HCT: 44.3 % (ref 36.0–46.0)
Hemoglobin: 15 g/dL (ref 12.0–15.0)
MCH: 33.9 pg (ref 26.0–34.0)
MCHC: 33.9 g/dL (ref 30.0–36.0)
MCV: 100 fL (ref 80.0–100.0)
Platelets: 175 10*3/uL (ref 150–400)
RBC: 4.43 MIL/uL (ref 3.87–5.11)
RDW: 13.9 % (ref 11.5–15.5)
WBC: 16.8 10*3/uL — ABNORMAL HIGH (ref 4.0–10.5)
nRBC: 0 % (ref 0.0–0.2)

## 2018-12-04 LAB — SARS CORONAVIRUS 2 BY RT PCR (HOSPITAL ORDER, PERFORMED IN ~~LOC~~ HOSPITAL LAB): SARS Coronavirus 2: NEGATIVE

## 2018-12-04 LAB — TYPE AND SCREEN
ABO/RH(D): O POS
Antibody Screen: NEGATIVE

## 2018-12-04 LAB — ABO/RH: ABO/RH(D): O POS

## 2018-12-04 MED ORDER — LACTATED RINGERS IV SOLN
INTRAVENOUS | Status: DC
  Administered 2018-12-04 (×2): via INTRAVENOUS
  Filled 2018-12-04 (×3): qty 1000

## 2018-12-04 MED ORDER — OXYTOCIN BOLUS FROM INFUSION
500.0000 mL | Freq: Once | INTRAVENOUS | Status: AC
  Administered 2018-12-04: 21:00:00 500 mL via INTRAVENOUS

## 2018-12-04 MED ORDER — ONDANSETRON HCL 4 MG PO TABS: 4.0000 mg | ORAL_TABLET | ORAL | Status: DC | PRN

## 2018-12-04 MED ORDER — PHENYLEPHRINE 40 MCG/ML (10ML) SYRINGE FOR IV PUSH (FOR BLOOD PRESSURE SUPPORT)
80.0000 ug | PREFILLED_SYRINGE | INTRAVENOUS | Status: DC | PRN
  Filled 2018-12-04: qty 10

## 2018-12-04 MED ORDER — ACETAMINOPHEN 325 MG PO TABS: 650.0000 mg | ORAL_TABLET | ORAL | Status: DC | PRN

## 2018-12-04 MED ORDER — SODIUM CHLORIDE 0.9 % IV SOLN
5.0000 10*6.[IU] | Freq: Once | INTRAVENOUS | Status: AC
  Administered 2018-12-04: 09:00:00 5 10*6.[IU] via INTRAVENOUS
  Filled 2018-12-04: qty 5

## 2018-12-04 MED ORDER — PHENYLEPHRINE 40 MCG/ML (10ML) SYRINGE FOR IV PUSH (FOR BLOOD PRESSURE SUPPORT)
80.0000 ug | PREFILLED_SYRINGE | INTRAVENOUS | Status: DC | PRN
  Filled 2018-12-04 (×2): qty 10

## 2018-12-04 MED ORDER — SIMETHICONE 80 MG PO CHEW: 80.0000 mg | CHEWABLE_TABLET | ORAL | Status: DC | PRN

## 2018-12-04 MED ORDER — ONDANSETRON HCL 4 MG/2ML IJ SOLN: 4.0000 mg | INTRAMUSCULAR | Status: DC | PRN

## 2018-12-04 MED ORDER — EPHEDRINE 5 MG/ML INJ
10.0000 mg | INTRAVENOUS | Status: DC | PRN
  Filled 2018-12-04: qty 2

## 2018-12-04 MED ORDER — WITCH HAZEL-GLYCERIN EX PADS
1.0000 "application " | MEDICATED_PAD | CUTANEOUS | Status: DC | PRN
  Filled 2018-12-04: qty 100

## 2018-12-04 MED ORDER — FENTANYL CITRATE (PF) 100 MCG/2ML IJ SOLN: 50.0000 ug | INTRAMUSCULAR | Status: DC | PRN

## 2018-12-04 MED ORDER — DIBUCAINE (PERIANAL) 1 % EX OINT: 1.0000 "application " | TOPICAL_OINTMENT | CUTANEOUS | Status: DC | PRN

## 2018-12-04 MED ORDER — ONDANSETRON HCL 4 MG/2ML IJ SOLN
4.0000 mg | Freq: Four times a day (QID) | INTRAMUSCULAR | Status: DC | PRN
  Administered 2018-12-04: 11:00:00 4 mg via INTRAVENOUS
  Filled 2018-12-04: qty 2

## 2018-12-04 MED ORDER — BENZOCAINE-MENTHOL 20-0.5 % EX AERO
1.0000 "application " | INHALATION_SPRAY | CUTANEOUS | Status: DC | PRN
  Administered 2018-12-05: 1 via TOPICAL
  Filled 2018-12-04: qty 56

## 2018-12-04 MED ORDER — IBUPROFEN 600 MG PO TABS
600.0000 mg | ORAL_TABLET | Freq: Four times a day (QID) | ORAL | Status: DC
  Administered 2018-12-05 (×4): 600 mg via ORAL
  Filled 2018-12-04 (×4): qty 1

## 2018-12-04 MED ORDER — PRENATAL MULTIVITAMIN CH
1.0000 | ORAL_TABLET | Freq: Every day | ORAL | Status: DC
  Administered 2018-12-05: 13:00:00 1 via ORAL
  Filled 2018-12-04: qty 1

## 2018-12-04 MED ORDER — OXYCODONE-ACETAMINOPHEN 5-325 MG PO TABS: 2.0000 | ORAL_TABLET | ORAL | Status: DC | PRN

## 2018-12-04 MED ORDER — DIPHENHYDRAMINE HCL 25 MG PO CAPS: 25.0000 mg | ORAL_CAPSULE | Freq: Four times a day (QID) | ORAL | Status: DC | PRN

## 2018-12-04 MED ORDER — FENTANYL-BUPIVACAINE-NACL 0.5-0.125-0.9 MG/250ML-% EP SOLN
12.0000 mL/h | EPIDURAL | Status: DC | PRN
  Filled 2018-12-04 (×2): qty 250

## 2018-12-04 MED ORDER — TETANUS-DIPHTH-ACELL PERTUSSIS 5-2.5-18.5 LF-MCG/0.5 IM SUSP: 0.5000 mL | Freq: Once | INTRAMUSCULAR | Status: DC

## 2018-12-04 MED ORDER — LIDOCAINE HCL (PF) 1 % IJ SOLN
INTRAMUSCULAR | Status: DC | PRN
  Administered 2018-12-04 (×2): 5 mL via EPIDURAL

## 2018-12-04 MED ORDER — SODIUM CHLORIDE (PF) 0.9 % IJ SOLN
INTRAMUSCULAR | Status: DC | PRN
  Administered 2018-12-04: 12 mL/h via EPIDURAL

## 2018-12-04 MED ORDER — LACTATED RINGERS IV SOLN
500.0000 mL | Freq: Once | INTRAVENOUS | Status: AC
  Administered 2018-12-04: 10:00:00 500 mL via INTRAVENOUS

## 2018-12-04 MED ORDER — OXYCODONE HCL 5 MG PO TABS: 5.0000 mg | ORAL_TABLET | ORAL | Status: DC | PRN

## 2018-12-04 MED ORDER — PENICILLIN G 3 MILLION UNITS IVPB - SIMPLE MED
3.0000 10*6.[IU] | INTRAVENOUS | Status: DC
  Administered 2018-12-04 (×2): 3 10*6.[IU] via INTRAVENOUS
  Filled 2018-12-04 (×7): qty 100

## 2018-12-04 MED ORDER — LIDOCAINE HCL (PF) 1 % IJ SOLN
30.0000 mL | INTRAMUSCULAR | Status: DC | PRN
  Filled 2018-12-04: qty 30

## 2018-12-04 MED ORDER — LACTATED RINGERS IV SOLN
500.0000 mL | INTRAVENOUS | Status: DC | PRN
  Administered 2018-12-04: 1000 mL via INTRAVENOUS
  Filled 2018-12-04: qty 1000

## 2018-12-04 MED ORDER — DIPHENHYDRAMINE HCL 50 MG/ML IJ SOLN
12.5000 mg | INTRAMUSCULAR | Status: DC | PRN
  Administered 2018-12-04: 15:00:00 12.5 mg via INTRAVENOUS
  Filled 2018-12-04: qty 1

## 2018-12-04 MED ORDER — OXYCODONE HCL 5 MG PO TABS
10.0000 mg | ORAL_TABLET | ORAL | Status: DC | PRN
  Administered 2018-12-05: 02:00:00 10 mg via ORAL
  Filled 2018-12-04: qty 2

## 2018-12-04 MED ORDER — OXYCODONE-ACETAMINOPHEN 5-325 MG PO TABS: 1.0000 | ORAL_TABLET | ORAL | Status: DC | PRN

## 2018-12-04 MED ORDER — OXYTOCIN 40 UNITS IN NORMAL SALINE INFUSION - SIMPLE MED
2.5000 [IU]/h | INTRAVENOUS | Status: DC
  Administered 2018-12-04: 2.5 [IU]/h via INTRAVENOUS
  Filled 2018-12-04: qty 1000

## 2018-12-04 MED ORDER — COCONUT OIL OIL: 1.0000 "application " | TOPICAL_OIL | Status: DC | PRN

## 2018-12-04 MED ORDER — SENNOSIDES-DOCUSATE SODIUM 8.6-50 MG PO TABS
2.0000 | ORAL_TABLET | ORAL | Status: DC
  Administered 2018-12-05: 2 via ORAL
  Filled 2018-12-04: qty 2

## 2018-12-04 MED ORDER — SOD CITRATE-CITRIC ACID 500-334 MG/5ML PO SOLN: 30.0000 mL | ORAL | Status: DC | PRN

## 2018-12-04 NOTE — H&P (Signed)
33 y.o. G1P0000 @ [redacted]w[redacted]d presents with contractions.  Patient reports that contractions started yesterday evening and have increased.  She also noted bright red vaginal bleeding just prior to presentation to MAU.  In MAU, RN noted contractions q3 minutes, and bleeding greater than anticipated for early labor. ? Small leakage of fluid this AM, clear / yellow.  Otherwise has good fetal movement.  Pregnancy c/b: 1. Chlamydia infection in first trimester:  Treated with repeat testing negative  Past Medical History:  Diagnosis Date  . Chlamydia 2019  . Medical history non-contributory     Past Surgical History:  Procedure Laterality Date  . WISDOM TOOTH EXTRACTION      OB History  Gravida Para Term Preterm AB Living  1 0 0 0 0 0  SAB TAB Ectopic Multiple Live Births  0 0 0 0      # Outcome Date GA Lbr Len/2nd Weight Sex Delivery Anes PTL Lv  1 Current             Social History   Socioeconomic History  . Marital status: Single    Spouse name: Not on file  . Number of children: Not on file  . Years of education: Not on file  . Highest education level: Not on file  Occupational History  . Not on file  Social Needs  . Financial resource strain: Not on file  . Food insecurity    Worry: Not on file    Inability: Not on file  . Transportation needs    Medical: Not on file    Non-medical: Not on file  Tobacco Use  . Smoking status: Former Research scientist (life sciences)  . Smokeless tobacco: Never Used  Substance and Sexual Activity  . Alcohol use: Not Currently  . Drug use: No  . Sexual activity: Yes    Birth control/protection: None   Patient has no known allergies.    Prenatal Transfer Tool  Maternal Diabetes: No Genetic Screening: Declined Maternal Ultrasounds/Referrals: Normal Fetal Ultrasounds or other Referrals:  None Maternal Substance Abuse:  Yes:  Type: Smoker--quit with pregnancy Significant Maternal Medications:  Meds include: Other: ondansetron Significant Maternal Lab Results:  Group B Strep positive  ABO, Rh: --/--/O POS, O POS Performed at [facility] [address], [place], [place] [address]  [phone]) Antibody: NEG (07/05 0830) Rubella: Immune (11/04 0000) RPR: Nonreactive (11/04 0000)  HBsAg: Negative (11/04 0000)  HIV: Non Reactive (11/04 1803)  GBS: Positive (06/10 0000)     Vitals:   12/04/18 1059 12/04/18 1101  BP: 127/81 127/81  Pulse: 95 95  Resp: 18 20  Temp:    SpO2:       General:  NAD Abdomen:  soft, gravid, EFW 6# Ex:  no edema SVE:  4/70/-2, no bleeding on my exam, no fluid, membranes feel in tact FHTs:  150s, moderate variability, early decelerations, category 1 Toco:  q2-3 minutes   A/P   33 y.o. G1P0000 [redacted]w[redacted]d presents with early labor and vaginal bleeding at term Admit to L&D Epidural now per patient request Vaginal bleeding--scant now, will monitor for signs of abruption  FSR/ vtx/ GBS positive--pcn  [place]

## 2018-12-04 NOTE — Anesthesia Procedure Notes (Signed)
Epidural Patient location during procedure: OB Start time: 12/04/2018 10:48 AM End time: 12/04/2018 10:51 AM  Staffing Anesthesiologist: Lyn Hollingshead, MD Performed: anesthesiologist   Preanesthetic Checklist Completed: patient identified, site marked, surgical consent, pre-op evaluation, timeout performed, IV checked, risks and benefits discussed and monitors and equipment checked  Epidural Patient position: sitting Prep: site prepped and draped and DuraPrep Patient monitoring: continuous pulse ox and blood pressure Approach: midline Location: L4-L5 Injection technique: LOR air  Needle:  Needle type: Tuohy  Needle gauge: 17 G Needle length: 9 cm and 9 Needle insertion depth: 5 cm cm Catheter type: closed end flexible Catheter size: 19 Gauge Catheter at skin depth: 10 cm Test dose: negative and Other  Assessment Sensory level: T10 Events: blood not aspirated, injection not painful, no injection resistance, negative IV test and no paresthesia  Additional Notes Reason for block:procedure for pain

## 2018-12-04 NOTE — MAU Note (Signed)
Jamie Ramos is a 33 y.o. at [redacted]w[redacted]d here in MAU reporting: contractions since last night, states they are back to back. Had a small amount of bleeding when using the bathroom. Had a small leak of fluid this morning, states it was yellow. +FM. Was 2.5 cm this past week.  Onset of complaint: last night  Pain score: 8/10  Vitals:   12/04/18 0750  BP: 134/87  Pulse: (!) 106  Resp: 20  Temp: 98.2 F (36.8 C)  SpO2: 100%     FHT: +FM  Lab orders placed from triage: none

## 2018-12-04 NOTE — Anesthesia Preprocedure Evaluation (Signed)
Anesthesia Evaluation  Patient identified by MRN, date of birth, ID band Patient awake    Reviewed: Allergy & Precautions, H&P , NPO status , Patient's Chart, lab work & pertinent test results  Airway Mallampati: I  TM Distance: >3 FB Neck ROM: full    Dental no notable dental hx. (+) Teeth Intact   Pulmonary former smoker,    Pulmonary exam normal breath sounds clear to auscultation       Cardiovascular negative cardio ROS Normal cardiovascular exam Rhythm:regular Rate:Normal     Neuro/Psych negative neurological ROS  negative psych ROS   GI/Hepatic negative GI ROS, Neg liver ROS,   Endo/Other  negative endocrine ROS  Renal/GU negative Renal ROS     Musculoskeletal   Abdominal Normal abdominal exam  (+)   Peds  Hematology negative hematology ROS (+)   Anesthesia Other Findings   Reproductive/Obstetrics (+) Pregnancy                             Anesthesia Physical Anesthesia Plan  ASA: II  Anesthesia Plan: Epidural   Post-op Pain Management:    Induction:   PONV Risk Score and Plan:   Airway Management Planned:   Additional Equipment:   Intra-op Plan:   Post-operative Plan:   Informed Consent: I have reviewed the patients History and Physical, chart, labs and discussed the procedure including the risks, benefits and alternatives for the proposed anesthesia with the patient or authorized representative who has indicated his/her understanding and acceptance.       Plan Discussed with:   Anesthesia Plan Comments:         Anesthesia Quick Evaluation

## 2018-12-04 NOTE — Progress Notes (Signed)
Comfortable with epidural  BP 125/84   Pulse 98   Temp 98.2 F (36.8 C) (Oral)   Resp 20   Ht 5\' 2"  (1.575 m)   Wt 65.8 kg   LMP 02/25/2018   SpO2 100%   BMI 26.54 kg/m  Toco: q2-5 minutes EFM:  140s, moderate variability, category 1 SVE:  7-8/90/0, AROM small clear fluid  A/P:  G1 @ [redacted]w[redacted]d w labor Anticipate SVD FSR/vtx/gbs+ pcn

## 2018-12-04 NOTE — Progress Notes (Signed)
Pt has super long acrylic nails on each finger. Oxygen level reading 92-93%. Would not measure oxygen level on toes.

## 2018-12-04 NOTE — Consult Note (Signed)
Neonatology Note:  Attendance at Code Apgar:   Our team responded to a Code Apgar call to room # 76 following NSVD, due to infant with apnea. The requesting physician was Dr. Carlis Abbott. The mother is a G1P0 at 41 2/[redacted] weeks GA,  O pos, GBS positive. When she first presented to MAU, there was some vaginal bleeding, but none since then. ROM occurred 6 hours PTD and the fluid was light green meconium. There had been some FHR decelerations during labor. At delivery, the baby was apneic. The OB nursing staff in attendance gave vigorous stimulation and a Code Apgar was called. Our team arrived at 3 minutes of life, at which time the baby was being given PPV. HR was > 100 and baby was making some respiratory effort, so I stopped PPV and bulb suctioned, getting some thick mucous out. O2 saturations gradually rose and the baby did not require supplemental O2. He continued to have upper airway congestion, so we performed DeLee suctioning, getting a small amount of clear mucous out. Ap 1/8. Lungs clear to auscultation, but there are mild upper airway rhonchi and occasional stridor, with minor subcostal retractions. He maintained O2 saturations in the mid 90s, so was allowed to have skin to skin time with his mother, keeping the pulse oximeter on. I instructed his supervising nurse to notify me for any further problems. I spoke with the mother and her brother (support person) in the DR, then transferred the baby to the Pediatrician's care.   Real Cons, MD

## 2018-12-05 LAB — CBC
HCT: 37 % (ref 36.0–46.0)
Hemoglobin: 12.6 g/dL (ref 12.0–15.0)
MCH: 34.2 pg — ABNORMAL HIGH (ref 26.0–34.0)
MCHC: 34.1 g/dL (ref 30.0–36.0)
MCV: 100.5 fL — ABNORMAL HIGH (ref 80.0–100.0)
Platelets: 161 10*3/uL (ref 150–400)
RBC: 3.68 MIL/uL — ABNORMAL LOW (ref 3.87–5.11)
RDW: 14 % (ref 11.5–15.5)
WBC: 19.6 10*3/uL — ABNORMAL HIGH (ref 4.0–10.5)
nRBC: 0 % (ref 0.0–0.2)

## 2018-12-05 MED ORDER — IBUPROFEN 100 MG/5ML PO SUSP
600.0000 mg | Freq: Four times a day (QID) | ORAL | Status: DC
  Administered 2018-12-05 – 2018-12-06 (×4): 600 mg via ORAL
  Filled 2018-12-05 (×4): qty 30

## 2018-12-05 MED ORDER — COMPLETENATE 29-1 MG PO CHEW
1.0000 | CHEWABLE_TABLET | Freq: Every day | ORAL | Status: DC
  Administered 2018-12-06: 11:00:00 1 via ORAL
  Filled 2018-12-05: qty 1

## 2018-12-05 NOTE — Anesthesia Postprocedure Evaluation (Signed)
Anesthesia Post Note  Patient: Jamie Ramos  Procedure(s) Performed: AN AD HOC LABOR EPIDURAL     Patient location during evaluation: Mother Baby Anesthesia Type: Epidural Level of consciousness: awake and alert Pain management: pain level controlled Vital Signs Assessment: post-procedure vital signs reviewed and stable Respiratory status: spontaneous breathing, nonlabored ventilation and respiratory function stable Cardiovascular status: stable Postop Assessment: no headache, no backache, epidural receding, no apparent nausea or vomiting, patient able to bend at knees, adequate PO intake and able to ambulate Anesthetic complications: no    Last Vitals:  Vitals:   12/05/18 0430 12/05/18 0735  BP: 108/71 118/76  Pulse: (!) 102 71  Resp: 18 20  Temp: 36.7 C 36.5 C  SpO2: 95%     Last Pain:  Vitals:   12/05/18 0735  TempSrc: Oral  PainSc: 4    Pain Goal:                Epidural/Spinal Function Cutaneous sensation: Normal sensation (12/05/18 0735), Patient able to flex knees: Yes (12/05/18 0735), Patient able to lift hips off bed: Yes (12/05/18 0735), Back pain beyond tenderness at insertion site: No (12/05/18 0735), Progressively worsening motor and/or sensory loss: No (12/05/18 0735), Bowel and/or bladder incontinence post epidural: No (12/05/18 0735)  France Ravens Hristova

## 2018-12-05 NOTE — Progress Notes (Signed)
Initial visit with Jamie Ramos and baby Jamie Ramos who were just moved up to Mother Baby from the NICU.  Jamie Ramos reports she's doing okay and adjusting to motherhood.  She's glad that Jamie Ramos is progressing well and she's working on getting hte hang of nursing before she is discharged.  Her brother is her support person and has been staying with her since mother's day and will stay with her for a while after discharge.  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  Jamie Ramos expressed some trepidation about motherhood, but is generally excited.  She hoped she'd be a mother by 35 and reports her 78th birthday was in November.  She is grateful for the opportunity and delighted by her son.  Please page as further needs arise.  Jamie Ramos. Elyn Peers, M.Div. [facility] Chaplain Pager [phone] Office [phone]

## 2018-12-05 NOTE — Progress Notes (Signed)
NBM was born vaginally @ 2122 and placed on mom's chest.  Baby was stimulated and with vigorous stimulation, baby was not breathing. Baby was taken to the warmer @ 2123.  Auscultated heartrate was found to be 50 and no breath sounds detected. Compressions were initiated immediately @ 2123 allong with PPV.  Compressions continued until 2125 when NBM heartrate was greater than 100 and started to breathe on his own.  NICU arrived and continued to assess baby.    R. Virginio Isidore,RN

## 2018-12-05 NOTE — Lactation Note (Signed)
This note was copied from a baby's chart. Lactation Consultation Note:  Mother is a P1. Infant is 40.2 weeks and is in NICU .  Infant is 34 hours old and has been breastfeeding well per Mother.  Mother reports that she is having nipple pain when infant is breastfeeding.  Observed that mother has a positional strip on her left nipple. Mother has been using  football hold only, mostly on the left breast. Mother reports that infant doesn't latch well on the right breast.   Offered to assist with placing infant back to breast in a different hold.  Infant placed in cross cradle hold with pillow support.  Infant latched on with a wide open mouth. Mother reports no discomfort with latch. Observed infant with good burst of suckling and audible swallows. Infant sustained latch for a 30  Min feeding.   Lots of teaching with Mother. Mother was taught to do breast compression and then hand express colostrum after feeding.  Mother was taught basic breastfeeding tips.   Discussed cue base feeding and cluster feeding . Advised mother to breastfeed infant 8-12 or more times in 24 hours.  Mother was given [facility] brochure . Encouraged mother to do frequent STS .  Reviewed [place] services at [facility] . Mother reports that she is active with [facility].  Mother is being transferred to Mother/Baby 4.   Patient Name: Jamie Ramos [id number] Date: 12/05/2018 Reason for consult: Initial assessment;1st time breastfeeding;NICU baby;Term   Maternal Data Has patient been taught Hand Expression?: Yes Does the patient have breastfeeding experience prior to this delivery?: No  Feeding Feeding Type: Breast Fed  LATCH Score Latch: Grasps breast easily, tongue down, lips flanged, rhythmical sucking.  Audible Swallowing: Spontaneous and intermittent  Type of Nipple: Everted at rest and after stimulation  Comfort (Breast/Nipple): Filling, red/small blisters or bruises, mild/mod discomfort(positional strip on the left  nipple)  Hold (Positioning): Assistance needed to correctly position infant at breast and maintain latch.  LATCH Score: 8  Interventions Interventions: Breast feeding basics reviewed;Assisted with latch;Skin to skin;Hand express;Breast compression;Adjust position;Support pillows;Position options;Expressed milk;Comfort gels  Lactation Tools Discussed/Used WIC Program: Yes   Consult Status Consult Status: Follow-up Date: 12/06/18 Follow-up type: In-patient    Jess Barters [facility] 12/05/2018, 4:16 PM

## 2018-12-05 NOTE — Lactation Note (Signed)
This note was copied from a baby's chart. Lactation Consultation Note  Patient Name: Jamie Ramos [id number] Date: 12/05/2018 Reason for consult: Difficult latch P1, 24 hour female infant, DAT+. Per mom, she started have pain with infant latching, LC noticed a nipple stripe on left breast.  Mom was given coconut oil, taught how to apply to breast and to coat breast flanges when pumping.  Mom latched infant on left breast while sitting in a chair, LC asked mom to tickle infant under nose with breast, wait until infant's tongue is down and bring infant chin first to breast, infant latched with wide mouth gape, swallows observed. Per mom, latch is not painful, infant breastfeed for 27 minutes with swallows observed, infant's latch was broken mom's nipple was well rounded. LC reviewed hand expression, mom taught back and infant was given 1 ml of colostrum by spoon. Mom plans to start pumping ( her choice) after latching infant to breast, mom will pump every 3 hours for 15 minutes and given infant back any EBM. Mom shown how to use DEBP & how to disassemble, clean, & reassemble parts. Mom will do as much STS as possible. LC discussed after 24 hours of life infant will cluster feeding.  Mom knows to call Nurse or LC if she has any questions,concerns or need assistance with latching infant to breast. Maternal Data Formula Feeding for Exclusion: No Has patient been taught Hand Expression?: Yes  Feeding    LATCH Score Latch: Grasps breast easily, tongue down, lips flanged, rhythmical sucking.  Audible Swallowing: A few with stimulation  Type of Nipple: Everted at rest and after stimulation  Comfort (Breast/Nipple): Filling, red/small blisters or bruises, mild/mod discomfort  Hold (Positioning): Assistance needed to correctly position infant at breast and maintain latch.  LATCH Score: 7  Interventions Interventions: Assisted with latch;Adjust position;Breast compression;Support  pillows;Position options;Hand express;Coconut oil  Lactation Tools Discussed/Used Tools: Coconut oil   Consult Status Consult Status: Follow-up Date: 12/06/18 Follow-up type: In-patient    Vicente Serene 12/05/2018, 9:30 PM

## 2018-12-05 NOTE — Progress Notes (Signed)
PPD#1 Pt without complaints. Lochia mild VSSAF IMP/ Stable Plan/ Routine care 

## 2018-12-06 NOTE — Discharge Summary (Addendum)
Obstetric Discharge Summary Reason for Admission: onset of labor and bleeding Prenatal Procedures: ultrasound Intrapartum Procedures: spontaneous vaginal delivery Postpartum Procedures: none Complications-Operative and Postpartum: 1st degree perineal laceration Hemoglobin  Date Value Ref Range Status  12/05/2018 12.6 12.0 - 15.0 g/dL Final   HCT  Date Value Ref Range Status  12/05/2018 37.0 36.0 - 46.0 % Final    Physical Exam:  General: alert and cooperative Lochia: appropriate Uterine Fundus: firm DVT Evaluation: No evidence of DVT seen on physical exam.  Discharge Diagnoses: Term Pregnancy-delivered  Discharge Information: Date: 12/06/2018 Activity: pelvic rest Diet: routine Medications: PNV and Ibuprofen Condition: stable Instructions: refer to practice specific booklet Discharge to: home Follow-up Information    Jerelyn Charles, MD Follow up in 4 week(s).   Specialty: Obstetrics Contact information: [address] [place] [address] [phone]           Newborn Data: Live born female  Birth Weight: 7 lb 1.9 oz (3230 g) APGAR: 1, 8  Newborn Delivery   Birth date/time: 12/04/2018 21:21:00 Delivery type: Vaginal, Spontaneous      Mother to remain in hospital after discharge, baby not quite ready for discharge  Daylene Katayama 12/06/2018, 10:18 AM

## 2018-12-06 NOTE — Lactation Note (Signed)
This note was copied from a baby's chart. Lactation Consultation Note  Patient Name: Jamie Ramos [id number] Date: 12/06/2018 Reason for consult: Follow-up assessment;Primapara;1st time breastfeeding;Nipple pain/trauma;Term;Infant weight loss(5 % weight loss - per mom to sore to latch - pumping and supplementing) LC assessed breast tissue with moms permission and noted both areolas to have edema / semi compressible and  Breast warm to touch. LC praised mom and mentioned to her that her milk was coming in .  [facility] recommended since mom feels to sore to latch the baby  To pump with the DEBP already set up .  Mom applied coconut oil to the nipples and areolas as is ready to pump with the DEBP.  LC reviewed the settings. Using the #24 F presently.  LC also instructed mom on the use of  Comfort gels for after feedings or pumping alternating with shells.  Per mom doesn't have a bra.  Mom may potentially need a DEBP from [facility] and they requested she call back tomorrow.   LC recommended feeding the baby at least 30 ml due to the age and when the nipples feel better to  Re-latch.   Maternal Data Has patient been taught Hand Expression?: Yes Does the patient have breastfeeding experience prior to this delivery?: No  Feeding Feeding Type: Bottle Fed - Formula Nipple Type: Slow - flow  LATCH Score                   Interventions Interventions: Hand express;Coconut oil;Shells;Comfort gels;DEBP  Lactation Tools Discussed/Used Tools: Pump;Shells;Coconut oil;Comfort gels(mom doesn't not have a bra at the hospital) Shell Type: Inverted Breast pump type: Double-Electric Breast Pump WIC Program: Yes Pump Review: Setup, frequency, and cleaning Initiated by:: DEBP was already set up and [place] reviewed settngs with mom   Consult Status Consult Status: Follow-up Date: 12/07/18 Follow-up type: In-patient    [place] 12/06/2018, 3:06 PM

## 2018-12-06 NOTE — Clinical Social Work Maternal (Signed)
CLINICAL SOCIAL WORK MATERNAL/CHILD NOTE  Patient Details  Name: Jamie Ramos MRN: [id number] Date of Birth: 10/13/1985  Date:  May 31, 2019  Clinical Social Worker Initiating Note:  Laurey Arrow Date/Time: Initiated:  12/05/18/1436     Child's Name:  Lucille Passy   Biological Parents:  Mother, Father(MOB reported FOB is Tracey Harries 06/06/1984)   Need for Interpreter:  None   Reason for Referral:  Current Substance Use/Substance Use During Pregnancy , Grief and Loss    Address:  [place] [address]    Phone number:  [phone] (home)     Additional phone number:   Household Members/Support Persons (HM/SP):       HM/SP Name Relationship DOB or Age  HM/SP -1        HM/SP -2        HM/SP -3        HM/SP -4        HM/SP -5        HM/SP -6        HM/SP -7        HM/SP -8          Natural Supports (not living in the home):  Immediate Family, Friends   Chiropodist: None   Employment: Animator   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   Education:  Nurse, adult   Homebound arranged:    Museum/gallery curator Resources:  Medicaid   Other Resources:  WIC(MOB plans to apply for week online this week.)   Cultural/Religious Considerations Which May Impact Care: None reported  Strengths:  Ability to meet basic needs , Lexicographer chosen, Home prepared for child    Psychotropic Medications:         Pediatrician:    [place] area  Pediatrician List:   [place]      Pediatrician Fax Number:    Risk Factors/Current Problems:  Substance Use    Cognitive State:  Insightful , Linear Thinking    Mood/Affect:  Interested , Happy , Bright , Relaxed    CSW Assessment: CSW met with MOB in room 326 to complete an assessment for SA hx and grief and loss.  When CSW  arrived, infant was asleep in isolette and MOB was resting in bed.  CSW explained CSW's role and encouraged MOB to ask question if they arises throughout the assessment.  MOB was easy to engage and was receptive to meeting with CSW.   CSW asked about MOB's recent loss of FOB and MOB shook her head as if CSW was given incorrect information.  MOB stated, "Landen's father is Legrand Como whom is not deceased.  I was dealing with 2 guys and the other guy was murdered a few months ago but he is not the father." CSW offered her condolences and apologized for having incorrect information; MOB was understanding.  MOB reported that she and FOB are no longer together however they are friends and have agreed to be the best co-parents. MOB also shared she has the support of her brother and FOB's family.    MOB acknowledged the loss of her father, mother, and sister over a 10 year span.  MOB shared feelings of being sad and hurt but manages to cope daily.  CSW offered outpatient therapy to assist MOB with processing her grief and loss and MOB  declined.  CSW attempted to process MOB's hesitation for declining outpatient services and MOB was unable to give an explanation.  CSW provided education regarding the baby blues period vs. perinatal mood disorders, discussed treatment and gave resources for mental health follow up if concerns arise.  CSW recommends self-evaluation during the postpartum time period using the New Mom Checklist from Postpartum Progress and encouraged MOB to contact a medical professional if symptoms are noted at any time.  CSW presented with insight and awareness and did not demonstrate any acute MH symptoms. CSW assessed for safety and MOB denied SI, HI, and DV.   CSW provided review of Sudden Infant Death Syndrome (SIDS) precautions.   MOB reported having all essential items for infant including a car seat and pack and play. MOB also acknowledged having a OBCM worker from [facility]  department.   CSW identifies no further need for intervention and no barriers to discharge at this time.  CSW Plan/Description:  No Further Intervention Required/No Barriers to Discharge, Hospital Drug Screen Policy Information, Other Patient/Family Education, Perinatal Mood and Anxiety Disorder (PMADs) Education, Sudden Infant Death Syndrome (SIDS) Education, Other Information/Referral to Community Resources, CSW Will Continue to Monitor Umbilical Cord Tissue Drug Screen Results and Make Report if Warranted   Chana Lindstrom Boyd-Gilyard, MSW, LCSW Clinical Social Work [phone]  Alissia Lory D BOYD-GILYARD, LCSW 12/06/2018, 8:41 AM 

## 2018-12-07 ENCOUNTER — Ambulatory Visit: Payer: Self-pay

## 2018-12-07 LAB — RPR: RPR Ser Ql: NONREACTIVE

## 2018-12-07 NOTE — Lactation Note (Signed)
This note was copied from a baby's chart. Lactation Consultation Note  Patient Name: Jamie Ramos EUMPN'T Date: 12/07/2018 Reason for consult: Follow-up assessment;Infant weight loss;Primapara;1st time breastfeeding;Term;Other (Comment)(mik is in and latch is improving and sore nipples with shells)  Baby is 60 hours old and per mom soreness has improved and I was able to latch and also has been supplementing with formula. Also has been pumping.  LC recommended since moms milk is to just breast feed and allow the baby to prevent over fullness or engorgement.  Per mom soreness has improved with the shells between feedings. Mom showed LC her breast / and they are full, no soreness, and no engorgement. Nodules noted.  Baby awake and hungry , LC offered to assist with latch after changing a large wet diaper.  Baby opened wide and LC guided mom through the football position and latch. Depth achieved and baby fed for 30 mins / consistent pattern/ increased with breast compressions. Baby satisfied after feeding and released on his own.  LC reviewed the breast feeding basics since her milk has come in. Baby is only 2 % weight loss, and LC reassured mom since her soreness has improved she should be ok using the hand pump. LC instructed mom on the use of the hand pump And checked the #24 F and it was a good fit. Made mom aware she has a #27 F if needed.  Sore nipples and engorgement prevention and tx reviewed.  LC stressed the importance of feeding 8-12 times a day around the clock/ STS until the baby is back to birth weight and  Gaining steadily.  LC discussed with mom the nutritive vs non- nutritive feeding patterns and the importance of watching the hanging out latched. LC offered to request and LC O/P appt and mom receptive to come back next week.  LC placed a request in the Epic basket for the clinic to call mom.  [place] reminded mom she could call with breast feeding questions , and the BF support  group virtually on Thursday am 11:30 am.  LC praised mom for her efforts breast  Feeding and reassured mom it takes time to get into a good routine.   Maternal Data Has patient been taught Hand Expression?: Yes(LC reviewed at this consult)  Feeding Feeding Type: Breast Fed Nipple Type: Slow - flow  LATCH Score Latch: Grasps breast easily, tongue down, lips flanged, rhythmical sucking.  Audible Swallowing: Spontaneous and intermittent  Type of Nipple: Everted at rest and after stimulation  Comfort (Breast/Nipple): Filling, red/small blisters or bruises, mild/mod discomfort  Hold (Positioning): Assistance needed to correctly position infant at breast and maintain latch.  LATCH Score: 8  Interventions Interventions: Breast feeding basics reviewed;Assisted with latch;Skin to skin;Breast massage;Hand express;Breast compression;Adjust position;Support pillows;Position options;Coconut oil;Shells;Comfort gels;Hand pump;DEBP  Lactation Tools Discussed/Used Tools: Shells;Pump;Flanges Flange Size: 24;27(LC checked flange and #24 a good fit) Shell Type: Inverted Breast pump type: Manual;Double-Electric Breast Pump WIC Program: Yes Pump Review: Milk Storage;Setup, frequency, and cleaning Initiated by:: MAI instructed mom on the use of the hand pump Date initiated:: 12/07/18   Consult Status Consult Status: Follow-up(LC offered to request mom a LC OP appt for next week and mom receptive) Date: ([place] placed a request in the [facility] clinic's basket) Follow-up type: [place] 12/07/2018, 10:00 AM

## 2020-06-27 ENCOUNTER — Ambulatory Visit: Payer: Medicaid Other
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
## 2022-12-22 ENCOUNTER — Ambulatory Visit
Admission: EM | Admit: 2022-12-22 | Discharge: 2022-12-22 | Disposition: A | Discharge status: Home / Self Care | Payer: Medicaid Other | Attending: Family Medicine | Admitting: Family Medicine

## 2022-12-22 DIAGNOSIS — K047 Periapical abscess without sinus: Secondary | ICD-10-CM

## 2022-12-22 DIAGNOSIS — R220 Localized swelling, mass and lump, head: Secondary | ICD-10-CM | POA: Diagnosis not present

## 2022-12-22 DIAGNOSIS — K0490 Unspecified diseases of pulp and periapical tissues: Secondary | ICD-10-CM

## 2022-12-22 DIAGNOSIS — Z3A01 Less than 8 weeks gestation of pregnancy: Secondary | ICD-10-CM | POA: Diagnosis not present

## 2022-12-22 MED ORDER — DEXAMETHASONE SODIUM PHOSPHATE 10 MG/ML IJ SOLN
10.0000 mg | Freq: Once | INTRAMUSCULAR | Status: AC
  Administered 2022-12-22: 10 mg via INTRAMUSCULAR

## 2022-12-22 MED ORDER — AMOXICILLIN 875 MG PO TABS: 875.0000 mg | ORAL_TABLET | Freq: Two times a day (BID) | ORAL | 0 refills | Status: AC

## 2022-12-22 NOTE — Discharge Instructions (Addendum)
Discussed if the facial swelling worsens or if you begin to notice your lower eyelid swelling this is a medical emergency and warrants immediate evaluation in the setting the ER.

## 2022-12-22 NOTE — ED Triage Notes (Signed)
Patient here today with c/o right side facial swelling since yesterday. Patient has been having dental problems off and on. Patient states that she is not in any pain.

## 2022-12-22 NOTE — ED Provider Notes (Signed)
[facility]    CSN: [id number] Arrival date & time: 12/22/22  1005      History   Chief Complaint Chief Complaint  Patient presents with   Facial Swelling    HPI Jamie Ramos is a 37 y.o. female.   HPI Patient presents for evaluation of facial swelling involving the right side of her face.  Patient is currently [redacted] weeks pregnant. She reports noticing mild tightness of the right side of her face x yesterday.  Patient has history of dental problems stemming from loss of crown involving the tooth # 6. She noticed this morning increased tenderness with opening her mouth and noticed visible swelling from mid nose extending down to her right jaw.  She is afebrile.   Past Medical History:  Diagnosis Date   Chlamydia 2019   Medical history non-contributory     Patient Active Problem List   Diagnosis Date Noted   Vaginal bleeding in pregnancy, third trimester 12/04/2018   Chlamydia 04/11/2018   Encounter for supervision of normal first pregnancy in first trimester 04/11/2018    Past Surgical History:  Procedure Laterality Date   WISDOM TOOTH EXTRACTION      OB History     Gravida  2   Para  1   Term  1   Preterm  0   AB  0   Living  1      SAB  0   IAB  0   Ectopic  0   Multiple  0   Live Births  1            Home Medications    Prior to Admission medications   Medication Sig Start Date End Date Taking? Authorizing Provider  amoxicillin (AMOXIL) 875 MG tablet Take 1 tablet (875 mg total) by mouth 2 (two) times daily for 10 days. 12/22/22 01/01/23 Yes Bing Neighbors, NP  Prenatal Vit-Fe Fumarate-FA (PRENATAL MULTIVITAMIN) TABS tablet Take 1 tablet by mouth daily at 12 noon.    [provider]    Family History History reviewed. No pertinent family history.  Social History Social History   Tobacco Use   Smoking status: Former   Smokeless tobacco: Never  Advertising account planner   Vaping status: Never Used  Substance Use Topics    Alcohol use: Not Currently   Drug use: No     Allergies   Patient has no known allergies.   Review of Systems Review of Systems Pertinent negatives listed in HPI   Physical Exam Triage Vital Signs ED Triage Vitals  Encounter Vitals Group     BP 12/22/22 1111 (!) 148/85     Systolic BP Percentile --      Diastolic BP Percentile --      Pulse Rate 12/22/22 1111 88     Resp 12/22/22 1111 16     Temp 12/22/22 1111 98.9 F (37.2 C)     Temp Source 12/22/22 1111 Oral     SpO2 12/22/22 1111 98 %     Weight 12/22/22 1110 115 lb (52.2 kg)     Height 12/22/22 1110 5\' 2"  (1.575 m)     Head Circumference --      Peak Flow --      Pain Score 12/22/22 1108 0     Pain Loc --      Pain Education --      Exclude from Growth Chart --    No data found.  Updated Vital Signs BP Marland Kitchen)  148/85 (BP Location: Right Arm)   Pulse 88   Temp 98.9 F (37.2 C) (Oral)   Resp 16   Ht 5\' 2"  (1.575 m)   Wt 115 lb (52.2 kg)   LMP 11/01/2022 (Exact Date)   SpO2 98%   Breastfeeding No   BMI 21.03 kg/m   Visual Acuity Right Eye Distance:   Left Eye Distance:   Bilateral Distance:    Right Eye Near:   Left Eye Near:    Bilateral Near:     Physical Exam Vitals reviewed.  Constitutional:      Appearance: Normal appearance.  HENT:     Head: Normocephalic.      Nose: Nose normal.  Eyes:     Extraocular Movements: Extraocular movements intact.     Conjunctiva/sclera: Conjunctivae normal.     Pupils: Pupils are equal, round, and reactive to light.  Cardiovascular:     Rate and Rhythm: Normal rate and regular rhythm.  Pulmonary:     Effort: Pulmonary effort is normal.     Breath sounds: Normal breath sounds.  Musculoskeletal:     Cervical back: Normal range of motion and neck supple.  Skin:    General: Skin is warm and dry.     Capillary Refill: Capillary refill takes less than 2 seconds.  Neurological:     General: No focal deficit present.     Mental Status: She is alert.       UC Treatments / Results  Labs (all labs ordered are listed, but only abnormal results are displayed) Labs Reviewed - No data to display  EKG   Radiology No results found.  Procedures Procedures (including critical care time)  Medications Ordered in UC Medications  dexamethasone (DECADRON) injection 10 mg (10 mg Intramuscular Given 12/22/22 1145)    Initial Impression / Assessment and Plan / UC Course  I have reviewed the triage vital signs and the nursing notes.  Pertinent labs & imaging results that were available during my care of the patient were reviewed by me and considered in my medical decision making (see chart for details).    Right facial swelling with secondary dental infection Amoxicillin 875 mg twice daily x 10 days.  Discussed the risk and benefits of steroid use given pregnancy.  We opted for Decadron injection and warm compresses to reduce facial swelling. Discussed concerns preseptal vs preorbital cellulitis and indications that warrant immediate evaluation in the setting of the emergency department. Patient verbalized understanding and agreement with plan today. Final Clinical Impressions(s) / UC Diagnoses   Final diagnoses:  Right facial swelling  Dental infection     Discharge Instructions      Discussed if the facial swelling worsens or if you begin to notice your lower eyelid swelling this is a medical emergency and warrants immediate evaluation in the setting the ER.     ED Prescriptions     Medication Sig Dispense Auth. Provider   amoxicillin (AMOXIL) 875 MG tablet Take 1 tablet (875 mg total) by mouth 2 (two) times daily for 10 days. 20 tablet Bing Neighbors, NP      PDMP not reviewed this encounter.   Bing Neighbors, NP 12/22/22 1151

## 2023-02-21 DIAGNOSIS — Z111 Encounter for screening for respiratory tuberculosis: Secondary | ICD-10-CM | POA: Diagnosis not present

## 2023-05-05 ENCOUNTER — Ambulatory Visit
Admission: RE | Admit: 2023-05-05 | Discharge: 2023-05-05 | Disposition: A | Discharge status: Home / Self Care | Payer: 59 | Source: Ambulatory Visit | Attending: Family Medicine | Admitting: Family Medicine

## 2023-05-05 VITALS — BP 152/99 | HR 100 | Temp 99.4°F | Resp 18

## 2023-05-05 DIAGNOSIS — R220 Localized swelling, mass and lump, head: Secondary | ICD-10-CM

## 2023-05-05 MED ORDER — DEXAMETHASONE SODIUM PHOSPHATE 10 MG/ML IJ SOLN
10.0000 mg | Freq: Once | INTRAMUSCULAR | Status: AC
  Administered 2023-05-05: 10 mg via INTRAMUSCULAR

## 2023-05-05 MED ORDER — CEFDINIR 300 MG PO CAPS: 300.0000 mg | ORAL_CAPSULE | Freq: Two times a day (BID) | ORAL | 0 refills | Status: DC

## 2023-05-05 NOTE — Discharge Instructions (Addendum)
Meds ordered this encounter  Medications   dexamethasone (DECADRON) injection 10 mg   cefdinir (OMNICEF) 300 MG capsule    Sig: Take 1 capsule (300 mg total) by mouth 2 (two) times daily.    Dispense:  20 capsule    Refill:  0

## 2023-05-05 NOTE — ED Triage Notes (Signed)
Pt presents to U/C with c/o swelling noted to her right side of face believes its from a chipped tooth on bottom x 1 day. Pt has applied cold compresses to help with swelling and she has taken OTC pain reliever.

## 2023-05-06 NOTE — ED Provider Notes (Signed)
  [facility]   [id number] 05/05/23 Arrival Time: 1257  ASSESSMENT & PLAN:  1. Right facial swelling    Ques allergic. Cannot r/o early erysipelas. Begin: Discharge Medication List as of 05/05/2023  1:50 PM     START taking these medications   Details  cefdinir (OMNICEF) 300 MG capsule Take 1 capsule (300 mg total) by mouth 2 (two) times daily., Starting Wed 05/05/2023, Normal       Watch closely.   Follow-up Information     [facility]).   Specialty: Urgent Care Why: If worsening or failing to improve as anticipated over the next 48 hours, sooner if needed. Contact information: [address] [place] [address] [phone]                Reviewed expectations re: course of current medical issues. Questions answered. Outlined signs and symptoms indicating need for more acute intervention. Understanding verbalized. After Visit Summary given.   SUBJECTIVE: History from: Patient. Jamie Ramos is a 37 y.o. female. Reports: R sided facial "swelling and feeling tight"; first noted yesterday; same and maybe slightly worse today. Feels cold compress to face has helped. Denies fever. Denies itching. Denies eye pain and/or vision changes.   OBJECTIVE:  Vitals:   05/05/23 1308  BP: (!) 152/99  Pulse: 100  Resp: 18  Temp: 99.4 F (37.4 C)  TempSrc: Oral  SpO2: 98%    General appearance: alert; no distress Eyes: PERRLA; EOMI; conjunctivae normal HENT: [place]; AT; without nasal congestion; she does have mild swelling to R upper cheek with slight erythema and warmth to touch Neck: supple without LAD Lungs: speaks full sentences without difficulty; unlabored Extremities: no edema Skin: warm and dry Neurologic: normal gait Psychological: alert and cooperative; normal mood and affect  No Known Allergies  Past Medical History:  Diagnosis Date   Chlamydia 2019   Medical history  non-contributory    Social History   Socioeconomic History   Marital status: Single    Spouse name: Not on file   Number of children: Not on file   Years of education: Not on file   Highest education level: Not on file  Occupational History   Not on file  Tobacco Use   Smoking status: Former   Smokeless tobacco: Never  Vaping Use   Vaping status: Never Used  Substance and Sexual Activity   Alcohol use: Not Currently   Drug use: No   Sexual activity: Yes    Birth control/protection: None  Other Topics Concern   Not on file  Social History Narrative   Not on file   Social Determinants of Health   Financial Resource Strain: Not on file  Food Insecurity: Not on file  Transportation Needs: Not on file  Physical Activity: Not on file  Stress: Not on file  Social Connections: Not on file  Intimate Partner Violence: Not on file   History reviewed. No pertinent family history. Past Surgical History:  Procedure Laterality Date   WISDOM TOOTH EXTRACTION       Mardella Layman, MD 05/06/23 1118

## 2023-05-11 ENCOUNTER — Emergency Department (HOSPITAL_BASED_OUTPATIENT_CLINIC_OR_DEPARTMENT_OTHER): Payer: 59

## 2023-05-11 ENCOUNTER — Inpatient Hospital Stay (HOSPITAL_BASED_OUTPATIENT_CLINIC_OR_DEPARTMENT_OTHER)
Admission: EM | Admit: 2023-05-11 | Discharge: 2023-05-15 | DRG: 158 | Disposition: A | Discharge status: Home / Self Care | Payer: 59 | Attending: Internal Medicine | Admitting: Internal Medicine

## 2023-05-11 ENCOUNTER — Encounter (HOSPITAL_BASED_OUTPATIENT_CLINIC_OR_DEPARTMENT_OTHER): Payer: Self-pay | Admitting: Emergency Medicine

## 2023-05-11 ENCOUNTER — Other Ambulatory Visit: Payer: Self-pay

## 2023-05-11 DIAGNOSIS — K047 Periapical abscess without sinus: Secondary | ICD-10-CM | POA: Diagnosis not present

## 2023-05-11 DIAGNOSIS — L03211 Cellulitis of face: Secondary | ICD-10-CM | POA: Diagnosis not present

## 2023-05-11 DIAGNOSIS — K029 Dental caries, unspecified: Secondary | ICD-10-CM | POA: Diagnosis present

## 2023-05-11 DIAGNOSIS — Z87891 Personal history of nicotine dependence: Secondary | ICD-10-CM

## 2023-05-11 DIAGNOSIS — E876 Hypokalemia: Secondary | ICD-10-CM | POA: Diagnosis not present

## 2023-05-11 LAB — CBC WITH DIFFERENTIAL/PLATELET
Abs Immature Granulocytes: 0.06 10*3/uL (ref 0.00–0.07)
Basophils Absolute: 0 10*3/uL (ref 0.0–0.1)
Basophils Relative: 0 %
Eosinophils Absolute: 0.1 10*3/uL (ref 0.0–0.5)
Eosinophils Relative: 1 %
HCT: 38.8 % (ref 36.0–46.0)
Hemoglobin: 13.2 g/dL (ref 12.0–15.0)
Immature Granulocytes: 0 %
Lymphocytes Relative: 23 %
Lymphs Abs: 4.1 10*3/uL — ABNORMAL HIGH (ref 0.7–4.0)
MCH: 32.7 pg (ref 26.0–34.0)
MCHC: 34 g/dL (ref 30.0–36.0)
MCV: 96 fL (ref 80.0–100.0)
Monocytes Absolute: 1.4 10*3/uL — ABNORMAL HIGH (ref 0.1–1.0)
Monocytes Relative: 8 %
Neutro Abs: 12.3 10*3/uL — ABNORMAL HIGH (ref 1.7–7.7)
Neutrophils Relative %: 68 %
Platelets: 275 10*3/uL (ref 150–400)
RBC: 4.04 MIL/uL (ref 3.87–5.11)
RDW: 13.7 % (ref 11.5–15.5)
WBC: 18 10*3/uL — ABNORMAL HIGH (ref 4.0–10.5)
nRBC: 0 % (ref 0.0–0.2)

## 2023-05-11 LAB — BASIC METABOLIC PANEL
Anion gap: 11 (ref 5–15)
BUN: 12 mg/dL (ref 6–20)
CO2: 21 mmol/L — ABNORMAL LOW (ref 22–32)
Calcium: 9.6 mg/dL (ref 8.9–10.3)
Chloride: 106 mmol/L (ref 98–111)
Creatinine, Ser: 0.65 mg/dL (ref 0.44–1.00)
GFR, Estimated: >60 mL/min (ref >60)
Glucose, Bld: 79 mg/dL (ref 70–99)
Potassium: 3.8 mmol/L (ref 3.5–5.1)
Sodium: 138 mmol/L (ref 135–145)

## 2023-05-11 MED ORDER — SODIUM CHLORIDE 0.9 % IV BOLUS
1000.0000 mL | Freq: Once | INTRAVENOUS | Status: AC
  Administered 2023-05-11: 1000 mL via INTRAVENOUS

## 2023-05-11 MED ORDER — HYDROCODONE-ACETAMINOPHEN 5-325 MG PO TABS
2.0000 | ORAL_TABLET | Freq: Once | ORAL | Status: AC
  Administered 2023-05-11: 2 via ORAL
  Filled 2023-05-11: qty 2

## 2023-05-11 MED ORDER — IOHEXOL 300 MG/ML  SOLN
100.0000 mL | Freq: Once | INTRAMUSCULAR | Status: AC | PRN
  Administered 2023-05-11: 75 mL via INTRAVENOUS

## 2023-05-11 NOTE — ED Notes (Signed)
Provider notified about patient requesting pain medication.

## 2023-05-11 NOTE — ED Provider Notes (Signed)
[place] [facility] AT [facility] Provider Note   CSN: [id number] Arrival date & time: 05/11/23  1633     History  Chief Complaint  Patient presents with   Facial Swelling    Jamie Ramos is a 37 y.o. female.  Patient complains of swelling to the right side of her face.  Patient reports that she was seen at urgent care on 12/4 and started on antibiotics.  Patient has been on cefdinir for the past 6 days.  Patient did a televisit today and the provider wanted to change her antibiotic to amoxicillin.  Patient reports swelling has increased today.  Patient denies any fever or chills.  She reports that she has increasing facial pain.  Patient denies any medical problems she does not have any diabetes.    The history is provided by the patient. No language interpreter was used.       Home Medications Prior to Admission medications   Medication Sig Start Date End Date Taking? Authorizing Provider  cefdinir (OMNICEF) 300 MG capsule Take 1 capsule (300 mg total) by mouth 2 (two) times daily. 05/05/23   Mardella Layman, MD  Prenatal Vit-Fe Fumarate-FA (PRENATAL MULTIVITAMIN) TABS tablet Take 1 tablet by mouth daily at 12 noon.    [provider]      Allergies    Patient has no known allergies.    Review of Systems   Review of Systems  All other systems reviewed and are negative.   Physical Exam Updated Vital Signs BP (!) 151/89 (BP Location: Right Arm)   Pulse 71   Temp 98.8 F (37.1 C) (Temporal)   Resp 18   Ht 5\' 2"  (1.575 m)   Wt 54 kg   LMP 04/23/2023 (Approximate)   SpO2 100%   BMI 21.77 kg/m  Physical Exam Vitals and nursing note reviewed.  Constitutional:      Appearance: She is well-developed.  HENT:     Head: Normocephalic.     Comments: Swollen right face firm to palpation, warm to touch.    Right Ear: External ear normal.     Left Ear: External ear normal.     Nose: Nose normal.     Mouth/Throat:     Mouth: Mucous  membranes are moist.  Eyes:     Pupils: Pupils are equal, round, and reactive to light.  Cardiovascular:     Rate and Rhythm: Normal rate.  Pulmonary:     Effort: Pulmonary effort is normal.  Abdominal:     General: There is no distension.  Musculoskeletal:        General: Normal range of motion.     Cervical back: Normal range of motion.  Skin:    General: Skin is warm.  Neurological:     General: No focal deficit present.     Mental Status: She is alert and oriented to person, place, and time.  Psychiatric:        Mood and Affect: Mood normal.     ED Results / Procedures / Treatments   Labs (all labs ordered are listed, but only abnormal results are displayed) Labs Reviewed  CBC WITH DIFFERENTIAL/PLATELET - Abnormal; Notable for the following components:      Result Value   WBC 18.0 (*)    Neutro Abs 12.3 (*)    Lymphs Abs 4.1 (*)    Monocytes Absolute 1.4 (*)    All other components within normal limits  BASIC METABOLIC PANEL - Abnormal; Notable for the  following components:   CO2 21 (*)    All other components within normal limits    EKG None  Radiology CT Maxillofacial W Contrast  Result Date: 05/11/2023 CLINICAL DATA:  Initial evaluation for acute right facial swelling. EXAM: CT MAXILLOFACIAL WITH CONTRAST TECHNIQUE: Multidetector CT imaging of the maxillofacial structures was performed with intravenous contrast. Multiplanar CT image reconstructions were also generated. RADIATION DOSE REDUCTION: This exam was performed according to the departmental dose-optimization program which includes automated exposure control, adjustment of the mA and/or kV according to patient size and/or use of iterative reconstruction technique. CONTRAST:  75mL OMNIPAQUE IOHEXOL 300 MG/ML  SOLN COMPARISON:  None Available. FINDINGS: Osseous: No acute osseous abnormality. No discrete or worrisome osseous lesions. Poor dentition with multiple scattered dental caries noted. 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 Orbits: Globes orbital soft tissues within normal limits. Sinuses: Paranasal sinuses are largely clear. Mastoid air cells and middle ear cavities are well pneumatized and free of fluid. Soft tissues: Soft tissue swelling with inflammatory stranding seen about the anterior right face, primarily involving the pre maxillary soft tissues and right nasal labial region. Findings suggestive of localized infection with cellulitis. Periapical lucencies about the underlying dentition, suggesting an odontogenic origin. Superimposed complex hypodense area at this location measuring approximately 1.5 x 1.1 x 0.6 cm, suspicious for phlegmon and early abscess formation. Unclear whether this reflects a frank drainable fluid collection at this time. Limited intracranial: Unremarkable. IMPRESSION: 1. Soft tissue swelling with inflammatory stranding about the anterior right face, primarily involving the pre maxillary soft tissues, consistent with localized infection with cellulitis. Periapical lucencies about the underlying dentition, suggesting an odontogenic origin. Superimposed complex hypodense area at this location measuring approximately 1.5 x 1.1 x 0.6 cm, suspicious for phlegmon and early abscess formation. Unclear whether this reflects a frank drainable fluid collection at this time. 2. Poor dentition with multiple scattered dental caries. Electronically Signed   By: Rise Mu M.D.   On: 05/11/2023 23:12    Procedures Procedures    Medications Ordered in ED Medications  sodium chloride 0.9 % bolus 1,000 mL (0 mLs Intravenous Stopped 05/11/23 2237)  iohexol (OMNIPAQUE) 300 MG/ML solution 100 mL (75 mLs Intravenous Contrast Given 05/11/23 2223)  HYDROcodone-acetaminophen (NORCO/VICODIN) 5-325 MG per tablet 2 tablet (2 tablets Oral Given 05/11/23 2234)    ED Course/ Medical Decision Making/ A&P                                 Medical Decision Making Patient  complains of increasing swelling to her right face.  Patient has been on antibiotics for the past 6 days.  Patient reports infection seems to be getting worse  Amount and/or Complexity of Data Reviewed External Data Reviewed: notes.    Details: Urgent care notes reviewed Labs: ordered. Decision-making details documented in ED Course.    Details: Labs ordered reviewed and interpreted patient has a white blood cell count of 18 Radiology: ordered and independent interpretation performed. Decision-making details documented in ED Course.    Details: CT scan maxillofacial shows soft tissue swelling anterior right face consistent with cellulitis.  Periapical lucencies.  Possible phlegmon Discussion of management or test interpretation with external provider(s): Hospitalist consulted for admission   Risk Prescription drug management.  When she was            Final Clinical Impression(s) / ED Diagnoses Final diagnoses:  None  Rx / DC Orders ED Discharge Orders     None         Osie Cheeks 05/12/23 0050    Gwyneth Sprout, MD 05/13/23 2042

## 2023-05-11 NOTE — ED Triage Notes (Signed)
Swelling in R side of face since 12/3. Pt states she was tx with abx and steroid med which did improve the swelling but that yesterday the swelling and pain started to increase increase again. Pt states yesterday tele-med told her to stop cefdinir and start amoxicillin but that the pharmacy doesn't have it right now.

## 2023-05-11 NOTE — ED Notes (Signed)
RN informed patient about waiting at least 4 hours after given medication in order to be able to safely drive if d/c. Patient understood.

## 2023-05-11 NOTE — ED Provider Notes (Incomplete)
[place] EMERGENCY DEPARTMENT AT [facility] Provider Note   CSN: [id number] Arrival date & time: 05/11/23  1633     History {Add pertinent medical, surgical, social history, OB history to HPI:1} Chief Complaint  Patient presents with  . Facial Swelling    Jamie Ramos is a 37 y.o. female.  Patient complains of swelling to the right side of her face.  Patient reports that she was seen at urgent care on 12/4 and started on antibiotics.  Patient has been on cefdinir for the past 6 days.  Patient did a televisit today and the provider wanted to change her antibiotic to amoxicillin.  Patient reports swelling has increased today.  Patient denies any fever or chills.  She reports that she has increasing facial pain.  Patient denies any medical problems she does not have any diabetes.    The history is provided by the patient. No language interpreter was used.       Home Medications Prior to Admission medications   Medication Sig Start Date End Date Taking? Authorizing Provider  cefdinir (OMNICEF) 300 MG capsule Take 1 capsule (300 mg total) by mouth 2 (two) times daily. 05/05/23   Mardella Layman, MD  Prenatal Vit-Fe Fumarate-FA (PRENATAL MULTIVITAMIN) TABS tablet Take 1 tablet by mouth daily at 12 noon.    [provider]      Allergies    Patient has no known allergies.    Review of Systems   Review of Systems  All other systems reviewed and are negative.   Physical Exam Updated Vital Signs BP (!) 151/89 (BP Location: Right Arm)   Pulse 71   Temp 98.8 F (37.1 C) (Temporal)   Resp 18   Ht 5\' 2"  (1.575 m)   Wt 54 kg   LMP 04/23/2023 (Approximate)   SpO2 100%   BMI 21.77 kg/m  Physical Exam Vitals and nursing note reviewed.  Constitutional:      Appearance: She is well-developed.  HENT:     Head: Normocephalic.     Comments: Swollen right face firm to palpation, warm to touch.    Right Ear: External ear normal.     Left Ear: External ear  normal.     Nose: Nose normal.     Mouth/Throat:     Mouth: Mucous membranes are moist.  Eyes:     Pupils: Pupils are equal, round, and reactive to light.  Cardiovascular:     Rate and Rhythm: Normal rate.  Pulmonary:     Effort: Pulmonary effort is normal.  Abdominal:     General: There is no distension.  Musculoskeletal:        General: Normal range of motion.     Cervical back: Normal range of motion.  Skin:    General: Skin is warm.  Neurological:     General: No focal deficit present.     Mental Status: She is alert and oriented to person, place, and time.  Psychiatric:        Mood and Affect: Mood normal.     ED Results / Procedures / Treatments   Labs (all labs ordered are listed, but only abnormal results are displayed) Labs Reviewed  CBC WITH DIFFERENTIAL/PLATELET - Abnormal; Notable for the following components:      Result Value   WBC 18.0 (*)    Neutro Abs 12.3 (*)    Lymphs Abs 4.1 (*)    Monocytes Absolute 1.4 (*)    All other components within normal limits  BASIC METABOLIC PANEL - Abnormal; Notable for the following components:   CO2 21 (*)    All other components within normal limits    EKG None  Radiology CT Maxillofacial W Contrast  Result Date: 05/11/2023 CLINICAL DATA:  Initial evaluation for acute right facial swelling. EXAM: CT MAXILLOFACIAL WITH CONTRAST TECHNIQUE: Multidetector CT imaging of the maxillofacial structures was performed with intravenous contrast. Multiplanar CT image reconstructions were also generated. RADIATION DOSE REDUCTION: This exam was performed according to the departmental dose-optimization program which includes automated exposure control, adjustment of the mA and/or kV according to patient size and/or use of iterative reconstruction technique. CONTRAST:  75mL OMNIPAQUE IOHEXOL 300 MG/ML  SOLN COMPARISON:  None Available. FINDINGS: Osseous: No acute osseous abnormality. No discrete or worrisome osseous lesions. Poor  dentition with multiple scattered dental caries noted. 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 Orbits: Globes orbital soft tissues within normal limits. Sinuses: Paranasal sinuses are largely clear. Mastoid air cells and middle ear cavities are well pneumatized and free of fluid. Soft tissues: Soft tissue swelling with inflammatory stranding seen about the anterior right face, primarily involving the pre maxillary soft tissues and right nasal labial region. Findings suggestive of localized infection with cellulitis. Periapical lucencies about the underlying dentition, suggesting an odontogenic origin. Superimposed complex hypodense area at this location measuring approximately 1.5 x 1.1 x 0.6 cm, suspicious for phlegmon and early abscess formation. Unclear whether this reflects a frank drainable fluid collection at this time. Limited intracranial: Unremarkable. IMPRESSION: 1. Soft tissue swelling with inflammatory stranding about the anterior right face, primarily involving the pre maxillary soft tissues, consistent with localized infection with cellulitis. Periapical lucencies about the underlying dentition, suggesting an odontogenic origin. Superimposed complex hypodense area at this location measuring approximately 1.5 x 1.1 x 0.6 cm, suspicious for phlegmon and early abscess formation. Unclear whether this reflects a frank drainable fluid collection at this time. 2. Poor dentition with multiple scattered dental caries. Electronically Signed   By: Rise Mu M.D.   On: 05/11/2023 23:12    Procedures Procedures  {Document cardiac monitor, telemetry assessment procedure when appropriate:1}  Medications Ordered in ED Medications  sodium chloride 0.9 % bolus 1,000 mL (0 mLs Intravenous Stopped 05/11/23 2237)  iohexol (OMNIPAQUE) 300 MG/ML solution 100 mL (75 mLs Intravenous Contrast Given 05/11/23 2223)  HYDROcodone-acetaminophen (NORCO/VICODIN) 5-325 MG per tablet 2 tablet (2 tablets Oral  Given 05/11/23 2234)    ED Course/ Medical Decision Making/ A&P   {   Click here for ABCD2, HEART and other calculatorsREFRESH Note before signing :1}                              Medical Decision Making Patient complains of increasing swelling to her right face.  Patient has been on antibiotics for the past 6 days.  Patient reports infection seems to be getting worse  Amount and/or Complexity of Data Reviewed External Data Reviewed: notes.    Details: Urgent care notes reviewed Labs: ordered. Decision-making details documented in ED Course.    Details: Labs ordered reviewed and interpreted patient has a white blood cell count of 18 Radiology: ordered and independent interpretation performed. Decision-making details documented in ED Course.    Details: CT scan maxillofacial shows soft tissue swelling anterior right face consistent with cellulitis.  Periapical lucencies.  Possible phlegmon Discussion of management or test interpretation with external provider(s): Hospitalist consulted for admission   Risk Prescription drug  management.  When she was      {Document critical care time when appropriate:1} {Document review of labs and clinical decision tools ie heart score, Chads2Vasc2 etc:1}  {Document your independent review of radiology images, and any outside records:1} {Document your discussion with family members, caretakers, and with consultants:1} {Document social determinants of health affecting pt's care:1} {Document your decision making why or why not admission, treatments were needed:1} Final Clinical Impression(s) / ED Diagnoses Final diagnoses:  None    Rx / DC Orders ED Discharge Orders     None

## 2023-05-12 DIAGNOSIS — Z87891 Personal history of nicotine dependence: Secondary | ICD-10-CM | POA: Diagnosis not present

## 2023-05-12 DIAGNOSIS — K047 Periapical abscess without sinus: Secondary | ICD-10-CM | POA: Diagnosis not present

## 2023-05-12 DIAGNOSIS — L03211 Cellulitis of face: Principal | ICD-10-CM

## 2023-05-12 DIAGNOSIS — E876 Hypokalemia: Secondary | ICD-10-CM | POA: Diagnosis not present

## 2023-05-12 DIAGNOSIS — K029 Dental caries, unspecified: Secondary | ICD-10-CM | POA: Diagnosis present

## 2023-05-12 LAB — HEPATIC FUNCTION PANEL
ALT: 9 U/L (ref 0–44)
AST: 17 U/L (ref 15–41)
Albumin: 4.2 g/dL (ref 3.5–5.0)
Alkaline Phosphatase: 54 U/L (ref 38–126)
Bilirubin, Direct: 0.1 mg/dL (ref 0.0–0.2)
Indirect Bilirubin: 0.6 mg/dL (ref 0.3–0.9)
Total Bilirubin: 0.7 mg/dL (ref <1.2)
Total Protein: 7.3 g/dL (ref 6.5–8.1)

## 2023-05-12 MED ORDER — ACETAMINOPHEN 650 MG RE SUPP: 650.0000 mg | Freq: Four times a day (QID) | RECTAL | Status: DC | PRN

## 2023-05-12 MED ORDER — KETOROLAC TROMETHAMINE 15 MG/ML IJ SOLN
15.0000 mg | Freq: Four times a day (QID) | INTRAMUSCULAR | Status: DC | PRN
  Administered 2023-05-12 – 2023-05-14 (×8): 15 mg via INTRAVENOUS
  Filled 2023-05-12 (×8): qty 1

## 2023-05-12 MED ORDER — ENOXAPARIN SODIUM 40 MG/0.4ML IJ SOSY
40.0000 mg | PREFILLED_SYRINGE | INTRAMUSCULAR | Status: DC
Start: 2023-05-12 — End: 2023-05-15
  Administered 2023-05-12 – 2023-05-14 (×2): 40 mg via SUBCUTANEOUS
  Filled 2023-05-12 (×2): qty 0.4

## 2023-05-12 MED ORDER — BENZOCAINE 10 % MT GEL
Freq: Four times a day (QID) | OROMUCOSAL | Status: DC | PRN
  Filled 2023-05-12 (×2): qty 9.4

## 2023-05-12 MED ORDER — SODIUM CHLORIDE 0.9 % IV SOLN
3.0000 g | Freq: Three times a day (TID) | INTRAVENOUS | Status: DC
  Administered 2023-05-12 – 2023-05-14 (×9): 3 g via INTRAVENOUS
  Filled 2023-05-12 (×10): qty 8

## 2023-05-12 MED ORDER — VANCOMYCIN HCL IN DEXTROSE 1-5 GM/200ML-% IV SOLN
1000.0000 mg | Freq: Once | INTRAVENOUS | Status: AC
  Administered 2023-05-12: 1000 mg via INTRAVENOUS
  Filled 2023-05-12: qty 200

## 2023-05-12 MED ORDER — ONDANSETRON HCL 4 MG/2ML IJ SOLN: 4.0000 mg | Freq: Four times a day (QID) | INTRAMUSCULAR | Status: DC | PRN

## 2023-05-12 MED ORDER — DIPHENHYDRAMINE HCL 50 MG/ML IJ SOLN
50.0000 mg | Freq: Once | INTRAMUSCULAR | Status: AC
  Administered 2023-05-12: 50 mg via INTRAVENOUS
  Filled 2023-05-12: qty 1

## 2023-05-12 MED ORDER — OXYCODONE HCL 5 MG PO TABS
5.0000 mg | ORAL_TABLET | Freq: Four times a day (QID) | ORAL | Status: DC | PRN
  Administered 2023-05-12 – 2023-05-13 (×3): 5 mg via ORAL
  Filled 2023-05-12 (×3): qty 1

## 2023-05-12 MED ORDER — VANCOMYCIN HCL 1250 MG/250ML IV SOLN
1250.0000 mg | INTRAVENOUS | Status: DC
  Administered 2023-05-13 – 2023-05-15 (×3): 1250 mg via INTRAVENOUS
  Filled 2023-05-12 (×4): qty 250

## 2023-05-12 MED ORDER — ACETAMINOPHEN 325 MG PO TABS: 650.0000 mg | ORAL_TABLET | Freq: Four times a day (QID) | ORAL | Status: DC | PRN

## 2023-05-12 MED ORDER — ONDANSETRON HCL 4 MG PO TABS: 4.0000 mg | ORAL_TABLET | Freq: Four times a day (QID) | ORAL | Status: DC | PRN

## 2023-05-12 NOTE — ED Notes (Signed)
Report called to [facility]

## 2023-05-12 NOTE — ED Notes (Addendum)
After vancomycin completed, pt c/o itching and ? Hives noted on torso EDP made aware and at bedside, possible red man syndrome benadryl ordered

## 2023-05-12 NOTE — Progress Notes (Signed)
Pharmacy Antibiotic Note  Jamie Ramos is a 36 y.o. female admitted on 05/11/2023 with  facial cellulitis/abscess .  Pharmacy has been consulted for Vancomycin/Unasyn dosing. WBC is elevated. Renal function appears good.   Plan: Vancomycin 1250 mg IV q24h >>>Estimated AUC: 467 Unasyn 3g IV q8h Trend WBC, temp, renal function  F/U infectious work-up Drug levels as indicated   Height: 5\' 2"  (157.5 cm) Weight: 54 kg (119 lb) IBW/kg (Calculated) : 50.1  Temp (24hrs), Avg:98.8 F (37.1 C), Min:98.8 F (37.1 C), Max:98.8 F (37.1 C)  Recent Labs  Lab 05/11/23 2048  WBC 18.0*  CREATININE 0.65    Estimated Creatinine Clearance: 77.6 mL/min (by C-G formula based on SCr of 0.65 mg/dL).    No Known Allergies  Abran Duke, PharmD, BCPS Clinical Pharmacist Phone: [phone]

## 2023-05-12 NOTE — ED Notes (Signed)
Called lab to add hepatic fx to previous blood work

## 2023-05-12 NOTE — Plan of Care (Addendum)
Plan of Care Note for accepted transfer  Patient: Jamie Ramos              ZOX:[id number]  DOA: 05/11/2023     Facility requesting transfer: [facility] emergency department Requesting Provider: Cheron Schaumann, PA  Reason for transfer: Facial cellulitis to underlying maxillary soft tissue cellulitis and questionable early dental abscess formation  Facility course: 37 year old female no significant past medical history presented to emergency department for evaluation for swelling and pain of the right sided face. Patient reports that she was seen at urgent care on 12/4 and started on antibiotics. Patient has been on cefdinir for the past 6 days. Patient did a televisit today and the provider wanted to change her antibiotic to amoxicillin. Patient reports swelling has increased today. Patient denies any fever or chills. She reports that she has increasing facial pain   Presentation reviewed ED patient found to elevated blood pressure 172/123 which has been improved to 151/89, heart rate 98, respiratory 17 O2 sat 100% room air. BMP unremarkable. CBC leukocytosis 18 otherwise unremarkable.   .Soft tissue swelling with inflammatory stranding about the anterior right face, primarily involving the pre maxillary soft tissues, consistent with localized infection with cellulitis. Periapical lucencies about the underlying dentition, suggesting an odontogenic origin. Superimposed complex hypodense area at this location measuring approximately 1.5 x 1.1 x 0.6 cm, suspicious for phlegmon and early abscess formation. Unclear whether this reflects a frank drainable fluid collection at this time. 2. Poor dentition with multiple scattered dental caries.    In the ED patient has been treated with Norco and 1 L of NS bolus.  Hospitalist has been contacted for management of right-sided facial cellulitis and possible early abscess abscess formation.  ED physician Dr. Pilar Plate spoke with on-call ENT Dr. Jearld Fenton  who stated that no need for ENT evaluation for this patient and there is no need for inpatient dental surgery.  Dr. Jearld Fenton mostly stated to treat patient with IV antibiotic however the teeth might need to be extracted but there is no need for inpatient dental consult.  Excepted for admission for IV antibiotic for facial cellulitis, early formation of dental abscess versus maxillary area soft tissue cellulitis.  Ordered blood cultures.  Plan of care: The patient is accepted for admission for inpatient/observation status to Telemetry unit, at [facility] long.  Check [email or web address] for on-call coverage.  [facility] will assume care on arrival to accepting facility. Until arrival, medical decision making responsibilities remain with the EDP.  However, [facility] available 24/7 for questions and assistance.   Nursing staff please page [facility] Admits and Consults [phone]) as soon as the patient arrives to the hospital.    Author: Tereasa Coop, MD  05/12/2023  [facility]

## 2023-05-12 NOTE — Progress Notes (Signed)
Patient arrived via gurney and transport from [facility] @ 6:10.   Patient ambulated to bathroom and bed with SBA.  Admitting team notified. Patient requesting pain medication.  VSS.

## 2023-05-12 NOTE — Plan of Care (Incomplete)
Plan of Care Note for accepted transfer  Patient: Jamie Ramos              ZOX:[id number]  DOA: 05/11/2023     Facility requesting transfer: [facility] emergency department Requesting Provider: Cheron Schaumann, PA  Reason for transfer:   Facility course: 37 year old female no significant past medical history presented to emergency department for evaluation for swelling and pain of the right sided face. Patient reports that she was seen at urgent care on 12/4 and started on antibiotics. Patient has been on cefdinir for the past 6 days. Patient did a televisit today and the provider wanted to change her antibiotic to amoxicillin. Patient reports swelling has increased today. Patient denies any fever or chills. She reports that she has increasing facial pain   Presentation reviewed ED patient found to elevated blood pressure 172/123 which has been improved to 151/89, heart rate 98, respiratory 17 O2 sat 100% room air. BMP unremarkable. CBC leukocytosis 18 otherwise unremarkable.  . Soft tissue swelling with inflammatory stranding about the anterior right face, primarily involving the pre maxillary soft tissues, consistent with localized infection with cellulitis. Periapical lucencies about the underlying dentition, suggesting an odontogenic origin. Superimposed complex hypodense area at this location measuring approximately 1.5 x 1.1 x 0.6 cm, suspicious for phlegmon and early abscess formation. Unclear whether this reflects a frank drainable fluid collection at this time. 2. Poor dentition with multiple scattered dental caries.     Plan of care: The patient is accepted for admission for inpatient/observation status to {levelofcareprnv:26563} unit, at {campuslistprnv:26564}.  Check [email or web address] for on-call coverage.  [facility] will assume care on arrival to accepting facility. Until arrival, medical decision making responsibilities remain with the EDP.  However, [facility] available 24/7 for  questions and assistance.   Nursing staff please page [facility] Admits and Consults [phone]) as soon as the patient arrives to the hospital.    Author: Tereasa Coop, MD  05/12/2023  [facility]

## 2023-05-12 NOTE — H&P (Signed)
History and Physical    Patient: Jamie Ramos NFA:[id number] DOB: Jan 02, 1986 DOA: 05/11/2023 DOS: the patient was seen and examined on 05/12/2023 PCP: Patient, No Pcp Per  Patient coming from: Home  Chief Complaint:  Chief Complaint  Patient presents with   Facial Swelling   HPI: Jamie Ramos is a 37 y.o. female with medical history significant of chlamydia, vaginal bleeding during third trimester pregnancy who presented to the emergency department with facial swelling in the setting of dental infection despite being on oral antibiotics from urgent care for the past week.  No fever or chills today, but has significant pressure/pain of the right facial area.  She denied trauma to the area, sore throat, wheezing or hemoptysis.  No chest pain, palpitations, diaphoresis, PND, orthopnea or pitting edema of the lower extremities.  No abdominal pain, nausea, emesis, diarrhea, constipation, melena or hematochezia.  No flank pain, dysuria, frequency or hematuria.  No polyuria, polydipsia, polyphagia or blurred vision.   Lab work: CBC showed a white count of 18.0 with 68% neutrophils, hemoglobin 13.2 g/dL and platelets 469.  LFTs were normal.  BMP showed a CO2 of 21 mmol/L, but was otherwise normal.  Imaging: CT maxillofacial with contrast shows soft tissue swelling with inflammatory stranding about the anterior right face, primarily involving the premaxillary soft tissues, consistent with localized infection with cellulitis.  Periapical lucencies about the underlying dentition, suggesting an odontogenic origin.  Superimposed complex hypodense area at this location measuring approximately 1.5 x 1.1 x 0.6 cm, suspicious for phlegmon and early abscess formation.  Unclear whether this reflects a Frahm drainable fluid collection at this time.  Poor dentition with multiple scattered dental caries.   ED course: Initial vital signs were temperature 98.8 F, pulse 98, respirations 17, BP 172/123 mmHg O2 sat  100% on room air.  The patient received 2 tablets of Norco, Benadryl 15 mg IVPB, normal saline 100 mg IVP, started on Unasyn 3 g every 8 hours and vancomycin per pharmacy.  Review of Systems: As mentioned in the history of present illness. All other systems reviewed and are negative. Past Medical History:  Diagnosis Date   Chlamydia 2019   Medical history non-contributory    Past Surgical History:  Procedure Laterality Date   WISDOM TOOTH EXTRACTION     Social History:  reports that she has quit smoking. She has never used smokeless tobacco. She reports that she does not currently use alcohol. She reports that she does not use drugs.  No Known Allergies  History reviewed. No pertinent family history.  Prior to Admission medications   Medication Sig Start Date End Date Taking? Authorizing Provider  cefdinir (OMNICEF) 300 MG capsule Take 1 capsule (300 mg total) by mouth 2 (two) times daily. 05/05/23   Mardella Layman, MD  Prenatal Vit-Fe Fumarate-FA (PRENATAL MULTIVITAMIN) TABS tablet Take 1 tablet by mouth daily at 12 noon.    [provider]    Physical Exam: Vitals:   05/12/23 0300 05/12/23 0400 05/12/23 0500 05/12/23 0611  BP: 92/76 107/84 123/81 (!) 150/97  Pulse: 83 78 82 87  Resp: 16 (!) 22 (!) 21   Temp: 98.1 F (36.7 C)   98.9 F (37.2 C)  TempSrc: Oral   Oral  SpO2: 100% 100% 100% 100%  Weight:    50 kg  Height:    5\' 2"  (1.575 m)   Physical Exam Vitals and nursing note reviewed.  Constitutional:      Appearance: Normal appearance.  HENT:  Head: Normocephalic.     Jaw: Tenderness and swelling present.     Comments: Left facial edema.    Nose: No rhinorrhea.     Mouth/Throat:     Mouth: Mucous membranes are moist.  Eyes:     General: No scleral icterus.    Pupils: Pupils are equal, round, and reactive to light.  Cardiovascular:     Rate and Rhythm: Normal rate and regular rhythm.  Pulmonary:     Effort: Pulmonary effort is normal.     Breath  sounds: Normal breath sounds.  Abdominal:     General: Bowel sounds are normal. There is no distension.     Palpations: Abdomen is soft.     Tenderness: There is no abdominal tenderness. There is no guarding.  Musculoskeletal:     Cervical back: Neck supple.     Right lower leg: No edema.     Left lower leg: No edema.  Skin:    General: Skin is warm and dry.  Neurological:     General: No focal deficit present.     Mental Status: She is alert and oriented to person, place, and time.  Psychiatric:        Mood and Affect: Mood normal.        Behavior: Behavior normal.     Data Reviewed:  Results are pending, will review when available.  Assessment and Plan: Principal Problem:   Facial cellulitis Admit to telemetry/inpatient. Continue IV fluids. Continue Unasyn 3 g IVPB daily. Continue vancomycin per pharmacy. Follow-up blood culture and sensitivity Analgesics as needed. Antiemetics as needed. Follow CBC and CMP in a.m.  Active Problems:   Dental caries Continue IV antibiotics as above. Advised to seek dental care after discharge.     Advance Care Planning:   Code Status: Full Code   Consults:   Family Communication:   Severity of Illness: The appropriate patient status for this patient is INPATIENT. Inpatient status is judged to be reasonable and necessary in order to provide the required intensity of service to ensure the patient's safety. The patient's presenting symptoms, physical exam findings, and initial radiographic and laboratory data in the context of their chronic comorbidities is felt to place them at high risk for further clinical deterioration. Furthermore, it is not anticipated that the patient will be medically stable for discharge from the hospital within 2 midnights of admission.   * I certify that at the point of admission it is my clinical judgment that the patient will require inpatient hospital care spanning beyond 2 midnights from the point of  admission due to high intensity of service, high risk for further deterioration and high frequency of surveillance required.*  Author: Bobette Mo, MD 05/12/2023 8:11 AM  For on call review [email or web address].   This document was prepared using Dragon voice recognition software and may contain some unintended transcription errors.

## 2023-05-12 NOTE — TOC CM/SW Note (Signed)
Transition of Care [facility]) - Inpatient Brief Assessment   Patient Details  Name: Jamie Ramos MRN: [id number] Date of Birth: August 02, 1985  Transition of [facility]) CM/SW Contact:    Howell Rucks, RN Phone Number: 05/12/2023, 11:05 AM   Clinical Narrative: Met with pt at bedside to introduce role of TOC/NCM and review for  dc planning, pt reports she has an assigned PCP but has not established care yet, no current home care services or home DME, pt reports she feels safe returning home with family support,  confirmed transportation is available at discharge. TOC Brief Assessment completed. No current TOC needs identified at this time.     Transition of Care Asessment: Insurance and Status: Insurance coverage has been reviewed Patient has primary care physician: Yes (ptr reports she has an assigned PCP but hasn't established care yet) Home environment has been reviewed: resides in an apartment Prior level of function:: Independent Prior/Current Home Services: No current home services Social Determinants of Health Reivew: SDOH reviewed no interventions necessary   Transition of care needs: no transition of care needs at this time

## 2023-05-13 DIAGNOSIS — L03211 Cellulitis of face: Secondary | ICD-10-CM | POA: Diagnosis not present

## 2023-05-13 LAB — HIV ANTIBODY (ROUTINE TESTING W REFLEX): HIV Screen 4th Generation wRfx: NONREACTIVE

## 2023-05-13 LAB — COMPREHENSIVE METABOLIC PANEL
ALT: 9 U/L (ref 0–44)
AST: 13 U/L — ABNORMAL LOW (ref 15–41)
Albumin: 3.2 g/dL — ABNORMAL LOW (ref 3.5–5.0)
Alkaline Phosphatase: 50 U/L (ref 38–126)
Anion gap: 9 (ref 5–15)
BUN: 7 mg/dL (ref 6–20)
CO2: 21 mmol/L — ABNORMAL LOW (ref 22–32)
Calcium: 8.1 mg/dL — ABNORMAL LOW (ref 8.9–10.3)
Chloride: 105 mmol/L (ref 98–111)
Creatinine, Ser: 0.71 mg/dL (ref 0.44–1.00)
GFR, Estimated: >60 mL/min (ref >60)
Glucose, Bld: 108 mg/dL — ABNORMAL HIGH (ref 70–99)
Potassium: 3.3 mmol/L — ABNORMAL LOW (ref 3.5–5.1)
Sodium: 135 mmol/L (ref 135–145)
Total Bilirubin: 0.7 mg/dL (ref <1.2)
Total Protein: 6.3 g/dL — ABNORMAL LOW (ref 6.5–8.1)

## 2023-05-13 LAB — CBC
HCT: 38.6 % (ref 36.0–46.0)
Hemoglobin: 12.4 g/dL (ref 12.0–15.0)
MCH: 32.5 pg (ref 26.0–34.0)
MCHC: 32.1 g/dL (ref 30.0–36.0)
MCV: 101 fL — ABNORMAL HIGH (ref 80.0–100.0)
Platelets: 258 10*3/uL (ref 150–400)
RBC: 3.82 MIL/uL — ABNORMAL LOW (ref 3.87–5.11)
RDW: 13.7 % (ref 11.5–15.5)
WBC: 12.4 10*3/uL — ABNORMAL HIGH (ref 4.0–10.5)
nRBC: 0 % (ref 0.0–0.2)

## 2023-05-13 NOTE — Plan of Care (Signed)
  Problem: Health Behavior/Discharge Planning: Goal: Ability to manage health-related needs will improve Outcome: Progressing   Problem: Clinical Measurements: Goal: Ability to maintain clinical measurements within normal limits will improve Outcome: Progressing Goal: Respiratory complications will improve Outcome: Progressing Goal: Cardiovascular complication will be avoided Outcome: Progressing   Problem: Activity: Goal: Risk for activity intolerance will decrease Outcome: Progressing   Problem: Nutrition: Goal: Adequate nutrition will be maintained Outcome: Progressing   Problem: Coping: Goal: Level of anxiety will decrease Outcome: Progressing   Problem: Elimination: Goal: Will not experience complications related to bowel motility Outcome: Progressing

## 2023-05-13 NOTE — Progress Notes (Signed)
PROGRESS NOTE    Jamie Ramos  VWU:[id number] DOB: 1985/11/27 DOA: 05/11/2023 PCP: Patient, No Pcp Per   Brief Narrative: 37 year old with past medical history significant for chlamydia, vaginal bleeding who presented emergency department with facial swelling in the setting of dental infection despite being on oral antibiotics, she was evaluated last week at the urgent care and prescribed antibiotics.  Evaluation in the ED, CT maxillofacial with contrast showed soft tissue swelling with inflammatory stranding about the anterior right face primarily involving the premaxillary soft tissue, consistent with localized infection with cellulitis.  Periapical lucency about the underlying definition, suggesting odontogenic origin.  Superimposed complex hypodense area at this location measuring 1.5 x 1 x 0.6.  Suspicious for phlegmon and early abscess.     Assessment & Plan:   Principal Problem:   Facial cellulitis Active Problems:   Dental caries   1-Facial Cellulitis, periapical early  abscess versus phlegmon: -Presents with worsening facial swelling, received omnicef out patient. Could start amoxicillin presented to ED.  CT:  Soft tissue swelling with inflammatory stranding about the anterior right face, primarily involving the pre maxillary soft tissues, consistent with localized infection with cellulitis. Periapical lucencies about the underlying dentition, suggesting an odontogenic origin. Superimposed complex hypodense area at this location measuring approximately 1.5 x 1.1 x 0.6 cm, suspicious for phlegmon and early abscess formation. Unclear whether this reflects a frank drainable fluid collection at this time. -Blood culture. No growth  -Continue with Unasyn, Vancomycin.  Swelling improving. WBC trending down.    2-Hypokalemia: replete orally.     Estimated body mass index is 20.16 kg/m as calculated from the following:   Height as of this encounter: 5\' 2"  (1.575 m).   Weight  as of this encounter: 50 kg.   DVT prophylaxis: Lovenox Code Status: Full code Family Communication: Care discussed with patient  Disposition Plan:  Status is: Inpatient Remains inpatient appropriate because: management of facial cellulitis.     Consultants:    Procedures:  None  Antimicrobials:  Unasyn Vancomycin.   Subjective: Report improvement of facial swelling. She has been abel to eat.    Objective: Vitals:   05/12/23 1013 05/12/23 1817 05/12/23 2123 05/13/23 0406  BP: 126/78 124/80 (!) 126/99 (!) 125/91  Pulse: 82 80 72 68  Resp: 16 20 18 18   Temp: 98.5 F (36.9 C) 98.4 F (36.9 C) 98.5 F (36.9 C) 98.9 F (37.2 C)  TempSrc: Oral Oral Oral Oral  SpO2: 100% 100% 98% 100%  Weight:      Height:        Intake/Output Summary (Last 24 hours) at 05/13/2023 0737 Last data filed at 05/13/2023 0300 Gross per 24 hour  Intake 1050 ml  Output --  Net 1050 ml   Filed Weights   05/11/23 1649 05/12/23 0611  Weight: 54 kg 50 kg    Examination:  General exam: Appears calm and comfortable  HEENT: Left side facial swelling, maxillary area. Poor dentition.  Respiratory system: Clear to auscultation. Respiratory effort normal. Cardiovascular system: S1 & S2 heard, RRR.  Gastrointestinal system: Abdomen is nondistended, soft and nontender. No organomegaly or masses felt. Normal bowel sounds heard. Central nervous system: Alert and oriented.  Extremities: Symmetric 5 x 5 power.    Data Reviewed: I have personally reviewed following labs and imaging studies  CBC: Recent Labs  Lab 05/11/23 2048 05/13/23 0508  WBC 18.0* 12.4*  NEUTROABS 12.3*  --   HGB 13.2 12.4  HCT 38.8 38.6  MCV 96.0 101.0*  PLT 275 258   Basic Metabolic Panel: Recent Labs  Lab 05/11/23 2048 05/13/23 0508  NA 138 135  K 3.8 3.3*  CL 106 105  CO2 21* 21*  GLUCOSE 79 108*  BUN 12 7  CREATININE 0.65 0.71  CALCIUM 9.6 8.1*   GFR: Estimated Creatinine Clearance: 77.5 mL/min  (by C-G formula based on SCr of 0.71 mg/dL). Liver Function Tests: Recent Labs  Lab 05/11/23 2048 05/13/23 0508  AST 17 13*  ALT 9 9  ALKPHOS 54 50  BILITOT 0.7 0.7  PROT 7.3 6.3*  ALBUMIN 4.2 3.2*   No results for input(s): "LIPASE", "AMYLASE" in the last 168 hours. No results for input(s): "AMMONIA" in the last 168 hours. Coagulation Profile: No results for input(s): "INR", "PROTIME" in the last 168 hours. Cardiac Enzymes: No results for input(s): "CKTOTAL", "CKMB", "CKMBINDEX", "TROPONINI" in the last 168 hours. BNP (last 3 results) No results for input(s): "PROBNP" in the last 8760 hours. HbA1C: No results for input(s): "HGBA1C" in the last 72 hours. CBG: No results for input(s): "GLUCAP" in the last 168 hours. Lipid Profile: No results for input(s): "CHOL", "HDL", "LDLCALC", "TRIG", "CHOLHDL", "LDLDIRECT" in the last 72 hours. Thyroid Function Tests: No results for input(s): "TSH", "T4TOTAL", "FREET4", "T3FREE", "THYROIDAB" in the last 72 hours. Anemia Panel: No results for input(s): "VITAMINB12", "FOLATE", "FERRITIN", "TIBC", "IRON", "RETICCTPCT" in the last 72 hours. Sepsis Labs: No results for input(s): "PROCALCITON", "LATICACIDVEN" in the last 168 hours.  No results found for this or any previous visit (from the past 240 hours).       Radiology Studies: CT Maxillofacial W Contrast Result Date: 05/11/2023 CLINICAL DATA:  Initial evaluation for acute right facial swelling. EXAM: CT MAXILLOFACIAL WITH CONTRAST TECHNIQUE: Multidetector CT imaging of the maxillofacial structures was performed with intravenous contrast. Multiplanar CT image reconstructions were also generated. RADIATION DOSE REDUCTION: This exam was performed according to the departmental dose-optimization program which includes automated exposure control, adjustment of the mA and/or kV according to patient size and/or use of iterative reconstruction technique. CONTRAST:  75mL OMNIPAQUE IOHEXOL 300 MG/ML   SOLN COMPARISON:  None Available. FINDINGS: Osseous: No acute osseous abnormality. No discrete or worrisome osseous lesions. Poor dentition with multiple scattered dental caries noted. 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 Orbits: Globes orbital soft tissues within normal limits. Sinuses: Paranasal sinuses are largely clear. Mastoid air cells and middle ear cavities are well pneumatized and free of fluid. Soft tissues: Soft tissue swelling with inflammatory stranding seen about the anterior right face, primarily involving the pre maxillary soft tissues and right nasal labial region. Findings suggestive of localized infection with cellulitis. Periapical lucencies about the underlying dentition, suggesting an odontogenic origin. Superimposed complex hypodense area at this location measuring approximately 1.5 x 1.1 x 0.6 cm, suspicious for phlegmon and early abscess formation. Unclear whether this reflects a frank drainable fluid collection at this time. Limited intracranial: Unremarkable. IMPRESSION: 1. Soft tissue swelling with inflammatory stranding about the anterior right face, primarily involving the pre maxillary soft tissues, consistent with localized infection with cellulitis. Periapical lucencies about the underlying dentition, suggesting an odontogenic origin. Superimposed complex hypodense area at this location measuring approximately 1.5 x 1.1 x 0.6 cm, suspicious for phlegmon and early abscess formation. Unclear whether this reflects a frank drainable fluid collection at this time. 2. Poor dentition with multiple scattered dental caries. Electronically Signed   By: Rise Mu M.D.   On: 05/11/2023 23:12  Scheduled Meds:  enoxaparin (LOVENOX) injection  40 mg Subcutaneous Q24H   Continuous Infusions:  ampicillin-sulbactam (UNASYN) IV 3 g (05/13/23 4098)   vancomycin Stopped (05/13/23 0217)     LOS: 1 day    Time spent:  35 Minutes    Jaide Hillenburg A Slayton Lubitz,  MD [facility]   If 7PM-7AM, please contact night-coverage [email or web address]  05/13/2023, 7:37 AM

## 2023-05-14 DIAGNOSIS — L03211 Cellulitis of face: Secondary | ICD-10-CM | POA: Diagnosis not present

## 2023-05-14 LAB — CBC
HCT: 35.8 % — ABNORMAL LOW (ref 36.0–46.0)
Hemoglobin: 12.2 g/dL (ref 12.0–15.0)
MCH: 33.4 pg (ref 26.0–34.0)
MCHC: 34.1 g/dL (ref 30.0–36.0)
MCV: 98.1 fL (ref 80.0–100.0)
Platelets: 271 10*3/uL (ref 150–400)
RBC: 3.65 MIL/uL — ABNORMAL LOW (ref 3.87–5.11)
RDW: 13.3 % (ref 11.5–15.5)
WBC: 10.2 10*3/uL (ref 4.0–10.5)
nRBC: 0 % (ref 0.0–0.2)

## 2023-05-14 NOTE — Plan of Care (Signed)
  Problem: Clinical Measurements: Goal: Will remain free from infection Outcome: Progressing   Problem: Education: Goal: Knowledge of General Education information will improve Description: Including pain rating scale, medication(s)/side effects and non-pharmacologic comfort measures Outcome: Adequate for Discharge   Problem: Health Behavior/Discharge Planning: Goal: Ability to manage health-related needs will improve Outcome: Adequate for Discharge   Problem: Clinical Measurements: Goal: Ability to maintain clinical measurements within normal limits will improve Outcome: Adequate for Discharge Goal: Diagnostic test results will improve Outcome: Adequate for Discharge Goal: Respiratory complications will improve Outcome: Adequate for Discharge Goal: Cardiovascular complication will be avoided Outcome: Adequate for Discharge   Problem: Activity: Goal: Risk for activity intolerance will decrease Outcome: Adequate for Discharge   Problem: Nutrition: Goal: Adequate nutrition will be maintained Outcome: Adequate for Discharge   Problem: Coping: Goal: Level of anxiety will decrease Outcome: Adequate for Discharge   Problem: Elimination: Goal: Will not experience complications related to bowel motility Outcome: Adequate for Discharge Goal: Will not experience complications related to urinary retention Outcome: Adequate for Discharge   Problem: Pain Management: Goal: General experience of comfort will improve Outcome: Adequate for Discharge   Problem: Safety: Goal: Ability to remain free from injury will improve Outcome: Adequate for Discharge   Problem: Skin Integrity: Goal: Risk for impaired skin integrity will decrease Outcome: Adequate for Discharge

## 2023-05-14 NOTE — Progress Notes (Signed)
PROGRESS NOTE    Jamie Ramos  ZOX:[id number] DOB: 1985/12/22 DOA: 05/11/2023 PCP: Patient, No Pcp Per   Brief Narrative: 37 year old with past medical history significant for chlamydia, vaginal bleeding who presented emergency department with facial swelling in the setting of dental infection despite being on oral antibiotics, she was evaluated last week at the urgent care and prescribed antibiotics.  Evaluation in the ED, CT maxillofacial with contrast showed soft tissue swelling with inflammatory stranding about the anterior right face primarily involving the premaxillary soft tissue, consistent with localized infection with cellulitis.  Periapical lucency about the underlying definition, suggesting odontogenic origin.  Superimposed complex hypodense area at this location measuring 1.5 x 1 x 0.6.  Suspicious for phlegmon and early abscess.    Assessment & Plan:   Principal Problem:   Facial cellulitis Active Problems:   Dental caries   1-Facial Cellulitis, periapical early  abscess versus phlegmon: -Presents with worsening facial swelling, received omnicef out patient. Could start amoxicillin presented to ED.  -CT:  Soft tissue swelling with inflammatory stranding about the anterior right face, primarily involving the pre maxillary soft tissues, consistent with localized infection with cellulitis. Periapical lucencies about the underlying dentition, suggesting an odontogenic origin. Superimposed complex hypodense area at this location measuring approximately 1.5 x 1.1 x 0.6 cm, suspicious for phlegmon and early abscess formation. Unclear whether this reflects a frank drainable fluid collection at this time. -Blood culture. No growth  -Continue with Unasyn, Vancomycin.  Swelling resolving, WBC normalized. Plan to discharge tomorrow if continue to improves.    2-Hypokalemia: Repalced    Estimated body mass index is 20.16 kg/m as calculated from the following:   Height as of  this encounter: 5\' 2"  (1.575 m).   Weight as of this encounter: 50 kg.   DVT prophylaxis: Lovenox Code Status: Full code Family Communication: Care discussed with patient  Disposition Plan:  Status is: Inpatient Remains inpatient appropriate because: management of facial cellulitis.     Consultants:    Procedures:  None  Antimicrobials:  Unasyn Vancomycin.   Subjective: Improvement of swelling, pain better controlled with toradol.   Objective: Vitals:   05/13/23 2120 05/14/23 0511 05/14/23 0512 05/14/23 1155  BP: (!) 146/96  (!) 146/95 131/89  Pulse: (!) 56 67 68 62  Resp: (!) 23 15  (!) 22  Temp: 99.2 F (37.3 C) 98.9 F (37.2 C)  98.6 F (37 C)  TempSrc: Oral Oral  Oral  SpO2: 100% 100%  99%  Weight:      Height:       No intake or output data in the 24 hours ending 05/14/23 1357  Filed Weights   05/11/23 1649 05/12/23 0611  Weight: 54 kg 50 kg    Examination:  General exam: NAD HEENT: Left side facial with less swelling, maxillary area. Poor dentition.  Respiratory system: CTA Cardiovascular system:S 1, S 2 RRR Gastrointestinal system: BS present, soft, nt Central nervous system: alert  Extremities: Symmetric 5 x 5 power.    Data Reviewed: I have personally reviewed following labs and imaging studies  CBC: Recent Labs  Lab 05/11/23 2048 05/13/23 0508 05/14/23 0624  WBC 18.0* 12.4* 10.2  NEUTROABS 12.3*  --   --   HGB 13.2 12.4 12.2  HCT 38.8 38.6 35.8*  MCV 96.0 101.0* 98.1  PLT 275 258 271   Basic Metabolic Panel: Recent Labs  Lab 05/11/23 2048 05/13/23 0508  NA 138 135  K 3.8 3.3*  CL 106 105  CO2  21* 21*  GLUCOSE 79 108*  BUN 12 7  CREATININE 0.65 0.71  CALCIUM 9.6 8.1*   GFR: Estimated Creatinine Clearance: 77.5 mL/min (by C-G formula based on SCr of 0.71 mg/dL). Liver Function Tests: Recent Labs  Lab 05/11/23 2048 05/13/23 0508  AST 17 13*  ALT 9 9  ALKPHOS 54 50  BILITOT 0.7 0.7  PROT 7.3 6.3*  ALBUMIN 4.2  3.2*   No results for input(s): "LIPASE", "AMYLASE" in the last 168 hours. No results for input(s): "AMMONIA" in the last 168 hours. Coagulation Profile: No results for input(s): "INR", "PROTIME" in the last 168 hours. Cardiac Enzymes: No results for input(s): "CKTOTAL", "CKMB", "CKMBINDEX", "TROPONINI" in the last 168 hours. BNP (last 3 results) No results for input(s): "PROBNP" in the last 8760 hours. HbA1C: No results for input(s): "HGBA1C" in the last 72 hours. CBG: No results for input(s): "GLUCAP" in the last 168 hours. Lipid Profile: No results for input(s): "CHOL", "HDL", "LDLCALC", "TRIG", "CHOLHDL", "LDLDIRECT" in the last 72 hours. Thyroid Function Tests: No results for input(s): "TSH", "T4TOTAL", "FREET4", "T3FREE", "THYROIDAB" in the last 72 hours. Anemia Panel: No results for input(s): "VITAMINB12", "FOLATE", "FERRITIN", "TIBC", "IRON", "RETICCTPCT" in the last 72 hours. Sepsis Labs: No results for input(s): "PROCALCITON", "LATICACIDVEN" in the last 168 hours.  Recent Results (from the past 240 hours)  Culture, blood (Routine X 2) w Reflex to ID Panel     Status: None (Preliminary result)   Collection Time: 05/12/23 12:48 AM   Specimen: BLOOD  Result Value Ref Range Status   Specimen Description   Final    BLOOD BLOOD RIGHT FOREARM Performed at [facility], [address] [place], [place] [address]    Special Requests   Final    BOTTLES DRAWN AEROBIC AND ANAEROBIC Blood Culture adequate volume Performed at [facility], [address] [place], [place] [address]    Culture   Final    NO GROWTH 2 DAYS Performed at [facility], [address] [place], [place] [address]    Report Status PENDING  Incomplete  Culture, blood (Routine X 2) w Reflex to ID Panel     Status: None (Preliminary result)   Collection Time: 05/12/23 12:48 AM   Specimen: BLOOD  Result Value Ref Range Status   Specimen Description   Final     BLOOD LEFT ANTECUBITAL Performed at [facility], [address] [place], [place] [address]    Special Requests   Final    BOTTLES DRAWN AEROBIC ONLY Blood Culture adequate volume Performed at [facility], [address], [place], [place] [address]    Culture   Final    NO GROWTH 2 DAYS Performed at [facility], [address] [place], [place] [address]    Report Status PENDING  Incomplete         Radiology Studies: No results found.       Scheduled Meds:  enoxaparin (LOVENOX) injection  40 mg Subcutaneous Q24H   Continuous Infusions:  ampicillin-sulbactam (UNASYN) IV 3 g (05/14/23 1348)   vancomycin 1,250 mg (05/13/23 2228)     LOS: 2 days    Time spent:  35 Minutes    Amandajo Gonder A Bilal Manzer, MD [facility]   If 7PM-7AM, please contact night-coverage [email or web address]  05/14/2023, 1:57 PM

## 2023-05-15 DIAGNOSIS — L03211 Cellulitis of face: Secondary | ICD-10-CM | POA: Diagnosis not present

## 2023-05-15 MED ORDER — AMOXICILLIN-POT CLAVULANATE 875-125 MG PO TABS: 1.0000 | ORAL_TABLET | Freq: Two times a day (BID) | ORAL | 0 refills | Status: DC

## 2023-05-15 MED ORDER — AMOXICILLIN-POT CLAVULANATE 875-125 MG PO TABS: 1.0000 | ORAL_TABLET | Freq: Two times a day (BID) | ORAL | 0 refills | Status: AC

## 2023-05-15 NOTE — Discharge Summary (Signed)
Physician Discharge Summary   Patient: Jamie Ramos MRN: [id number] DOB: 1985/12/04  Admit date:     05/11/2023  Discharge date: 05/15/23  Discharge Physician: Alba Cory   PCP: Patient, No Pcp Per   Recommendations at discharge:    Needs follow up with Dentist\   Discharge Diagnoses: Principal Problem:   Facial cellulitis Active Problems:   Dental caries  Resolved Problems:   * No resolved hospital problems. [facility] Course: 37-year-old with past medical history significant for chlamydia, vaginal bleeding who presented emergency department with facial swelling in the setting of dental infection despite being on oral antibiotics, she was evaluated last week at the urgent care and prescribed antibiotics.  Evaluation in the ED, CT maxillofacial with contrast showed soft tissue swelling with inflammatory stranding about the anterior right face primarily involving the premaxillary soft tissue, consistent with localized infection with cellulitis.  Periapical lucency about the underlying definition, suggesting odontogenic origin.  Superimposed complex hypodense area at this location measuring 1.5 x 1 x 0.6.  Suspicious for phlegmon and early abscess.    Assessment and Plan: No notes have been filed under this hospital service. Service: Hospitalist 1-Facial Cellulitis, periapical early  abscess versus phlegmon: -Presents with worsening facial swelling, received omnicef out patient. Could start amoxicillin presented to ED.  -CT:  Soft tissue swelling with inflammatory stranding about the anterior right face, primarily involving the pre maxillary soft tissues, consistent with localized infection with cellulitis. Periapical lucencies about the underlying dentition, suggesting an odontogenic origin. Superimposed complex hypodense area at this location measuring approximately 1.5 x 1.1 x 0.6 cm, suspicious for phlegmon and early abscess formation. Unclear whether this reflects a  frank drainable fluid collection at this time. -Blood culture. No growth  -Treated  with Unasyn, Vancomycin.  Swelling resolving, WBC normalized.  Discharge on Augmentin for 12 days. She has appointment with Dentist 12/30   2-Hypokalemia: Repalced           Consultants: None Procedures performed: None Disposition: Home Diet recommendation:  Discharge Diet Orders (From admission, onward)     Start     Ordered   05/15/23 0000  Diet - low sodium heart healthy        05/15/23 0906           Regular diet DISCHARGE MEDICATION: Allergies as of 05/15/2023   No Known Allergies      Medication List     STOP taking these medications    amoxicillin 500 MG tablet Commonly known as: AMOXIL   cefdinir 300 MG capsule Commonly known as: OMNICEF       TAKE these medications    acetaminophen 500 MG tablet Commonly known as: TYLENOL Take 500 mg by mouth every 6 (six) hours as needed for mild pain (pain score 1-3) or moderate pain (pain score 4-6).   amoxicillin-clavulanate 875-125 MG tablet Commonly known as: AUGMENTIN Take 1 tablet by mouth 2 (two) times daily.   Mili 0.25-35 MG-MCG tablet Generic drug: norgestimate-ethinyl estradiol Take 1 tablet by mouth daily.        Discharge Exam: Filed Weights   05/11/23 1649 05/12/23 0611  Weight: 54 kg 50 kg  General; NAD  Condition at discharge: stable  The results of significant diagnostics from this hospitalization (including imaging, microbiology, ancillary and laboratory) are listed below for reference.   Imaging Studies: CT Maxillofacial W Contrast Result Date: 05/11/2023 CLINICAL DATA:  Initial evaluation for acute right facial swelling. EXAM: CT MAXILLOFACIAL WITH CONTRAST TECHNIQUE: Multidetector  CT imaging of the maxillofacial structures was performed with intravenous contrast. Multiplanar CT image reconstructions were also generated. RADIATION DOSE REDUCTION: This exam was performed according to the  departmental dose-optimization program which includes automated exposure control, adjustment of the mA and/or kV according to patient size and/or use of iterative reconstruction technique. CONTRAST:  75mL OMNIPAQUE IOHEXOL 300 MG/ML  SOLN COMPARISON:  None Available. FINDINGS: Osseous: No acute osseous abnormality. No discrete or worrisome osseous lesions. Poor dentition with multiple scattered dental caries noted. 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 Orbits: Globes orbital soft tissues within normal limits. Sinuses: Paranasal sinuses are largely clear. Mastoid air cells and middle ear cavities are well pneumatized and free of fluid. Soft tissues: Soft tissue swelling with inflammatory stranding seen about the anterior right face, primarily involving the pre maxillary soft tissues and right nasal labial region. Findings suggestive of localized infection with cellulitis. Periapical lucencies about the underlying dentition, suggesting an odontogenic origin. Superimposed complex hypodense area at this location measuring approximately 1.5 x 1.1 x 0.6 cm, suspicious for phlegmon and early abscess formation. Unclear whether this reflects a frank drainable fluid collection at this time. Limited intracranial: Unremarkable. IMPRESSION: 1. Soft tissue swelling with inflammatory stranding about the anterior right face, primarily involving the pre maxillary soft tissues, consistent with localized infection with cellulitis. Periapical lucencies about the underlying dentition, suggesting an odontogenic origin. Superimposed complex hypodense area at this location measuring approximately 1.5 x 1.1 x 0.6 cm, suspicious for phlegmon and early abscess formation. Unclear whether this reflects a frank drainable fluid collection at this time. 2. Poor dentition with multiple scattered dental caries. Electronically Signed   By: Rise Mu M.D.   On: 05/11/2023 23:12    Microbiology: Results for orders placed or  performed during the hospital encounter of 05/11/23  Culture, blood (Routine X 2) w Reflex to ID Panel     Status: None (Preliminary result)   Collection Time: 05/12/23 12:48 AM   Specimen: BLOOD  Result Value Ref Range Status   Specimen Description   Final    BLOOD BLOOD RIGHT FOREARM Performed at [facility], [address] [place], [place] [address]    Special Requests   Final    BOTTLES DRAWN AEROBIC AND ANAEROBIC Blood Culture adequate volume Performed at [facility], [address] [place], [place] [address]    Culture   Final    NO GROWTH 2 DAYS Performed at [facility], [address], [place], [place] [address]    Report Status PENDING  Incomplete  Culture, blood (Routine X 2) w Reflex to ID Panel     Status: None (Preliminary result)   Collection Time: 05/12/23 12:48 AM   Specimen: BLOOD  Result Value Ref Range Status   Specimen Description   Final    BLOOD LEFT ANTECUBITAL Performed at [facility], [address], [place], [place] [address]    Special Requests   Final    BOTTLES DRAWN AEROBIC ONLY Blood Culture adequate volume Performed at [facility], [address], [place], [place] [address]    Culture   Final    NO GROWTH 2 DAYS Performed at [facility], [address] [place], [place] [address]    Report Status PENDING  Incomplete    Labs: CBC: Recent Labs  Lab 05/11/23 2048 05/13/23 0508 05/14/23 0624  WBC 18.0* 12.4* 10.2  NEUTROABS 12.3*  --   --   HGB 13.2 12.4  12.2  HCT 38.8 38.6 35.8*  MCV 96.0 101.0* 98.1  PLT 275 258 271   Basic Metabolic Panel: Recent Labs  Lab 05/11/23 2048 05/13/23 0508  NA 138 135  K 3.8 3.3*  CL 106 105  CO2 21* 21*  GLUCOSE 79 108*  BUN 12 7  CREATININE 0.65 0.71  CALCIUM 9.6 8.1*   Liver Function Tests: Recent Labs  Lab 05/11/23 2048 05/13/23 0508  AST 17 13*  ALT 9 9  ALKPHOS 54 50  BILITOT 0.7  0.7  PROT 7.3 6.3*  ALBUMIN 4.2 3.2*   CBG: No results for input(s): "GLUCAP" in the last 168 hours.  Discharge time spent: greater than 30 minutes.  Signed: Alba Cory, MD [facility] 05/15/2023

## 2023-05-17 ENCOUNTER — Telehealth: Payer: Self-pay

## 2023-05-17 LAB — CULTURE, BLOOD (ROUTINE X 2)
Culture: NO GROWTH
Culture: NO GROWTH
Special Requests: ADEQUATE
Special Requests: ADEQUATE

## 2023-05-17 NOTE — Transitions of Care (Post Inpatient/ED Visit) (Signed)
05/17/2023  Name: Jamie Ramos MRN: [id number] DOB: [birth year]  Today's TOC FU Call Status: Today's TOC FU Call Status:: Successful TOC FU Call Completed TOC FU Call Complete Date: 05/17/23 (Voicemail mesage received from pt retruning RN CM call-return call to pt) Patient's Name and Date of Birth confirmed.  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-plans to rest today while he's at school & return to work tomorrow-had some headaches-relieved with Tylenol, facial swelling shrinking-getting better-taking abxs) Any questions or concerns?: No  Items Reviewed: Did you receive and understand the discharge instructions provided?: Yes Medications obtained,verified, and reconciled?: Yes (Medications Reviewed) Any new allergies since your discharge?: No Dietary orders reviewed?: Yes Type of Diet Ordered:: low salt/heart healthy Do you have support at home?: Yes People in Home: child(ren), dependent  Medications Reviewed Today: Medications Reviewed Today     Reviewed by Charlyn Minerva, RN (Registered Nurse) on 05/17/23 at [phone]  Med List Status: <None>   Medication Order Taking? Sig Documenting Provider Last Dose Status Informant  acetaminophen (TYLENOL) 500 MG tablet [id number] Yes Take 500 mg by mouth every 6 (six) hours as needed for mild pain (pain score 1-3) or moderate pain (pain score 4-6). [provider] 05/16/2023 Active Self, Pharmacy Records  amoxicillin-clavulanate (AUGMENTIN) 875-125 MG tablet [id number] Yes Take 1 tablet by mouth 2 (two) times daily for 12 days. Alba Cory, MD 05/16/2023 Active   MILI 0.25-35 MG-MCG tablet [id number] Yes  Take 1 tablet by mouth daily. [provider] 05/16/2023 Active Self, Pharmacy Records            Home Care and Equipment/Supplies: Were Home Health Services Ordered?: NA Any new equipment or medical supplies ordered?: NA  Functional Questionnaire: Do you need assistance with bathing/showering or dressing?: No Do you need assistance with meal preparation?: No Do you need assistance with eating?: No Do you have difficulty maintaining continence: No Do you need assistance with getting out of bed/getting out of a chair/moving?: No Do you have difficulty managing or taking your medications?: No  Follow up appointments reviewed: PCP Follow-up appointment confirmed?: Yes Date of PCP follow-up appointment?: 05/21/23 Follow-up Provider: [facility]-pt confirms she had appt today to establish care wih office but rescheduled to Fri-due to her being tired and needing to rest today Specialist Hospital Follow-up appointment confirmed?: No Reason Specialist Follow-Up Not Confirmed: Patient has Specialist Provider Number and will Call for Appointment (pt aware that she needs to follow up with dentist-called her denist and they are unabel to see her until 12/30-she will call some other local dental offices today to see if she can get a sooner appt) Do you need transportation to your follow-up appointment?: No Do you understand care options if your condition(s) worsen?: Yes-patient verbalized understanding  SDOH Interventions Today    Flowsheet Row Most Recent Value  SDOH Interventions   Food Insecurity Interventions Intervention Not Indicated  Housing Interventions Intervention Not Indicated  Transportation Interventions Intervention Not Indicated  Utilities Interventions Intervention Not Indicated      TOC interventions discussed/reviewed: -Discussed/reviewed insurance/health plans benefits -Doctor visit discussed/reviewed -PCP -Doctor visits  discussed/reviewed-Specialist -Provided Verbal Education: 30-day TOC program, nutrition, meds & their functions, symptom mgmt., fall/safety measures in the home, s/s of infection,  when to seek medical attention -Provided with RN CM contact info-advised to call for any questions/concerns   Antionette Fairy, RN,BSN,CCM RN Care Manager Transitions of Care  [place]  Direct Phone: [phone] Toll Free: [phone] Fax: [phone]

## 2023-05-17 NOTE — Transitions of Care (Post Inpatient/ED Visit) (Signed)
   05/17/2023  Name: Jamie Ramos MRN: [id number] DOB: 12/03/1985  Today's TOC FU Call Status: Today's TOC FU Call Status:: Unsuccessful Call (1st Attempt) Unsuccessful Call (1st Attempt) Date: 05/17/23  Attempted to reach the patient regarding the most recent Inpatient/ED visit.  Follow Up Plan: Additional outreach attempts will be made to reach the patient to complete the Transitions of Care (Post Inpatient/ED visit) call.     Antionette Fairy, RN,BSN,CCM RN Care Manager Transitions of Care  [place]  Direct Phone: [phone] Toll Free: [phone] Fax: [phone]

## 2023-05-21 DIAGNOSIS — Z09 Encounter for follow-up examination after completed treatment for conditions other than malignant neoplasm: Secondary | ICD-10-CM | POA: Diagnosis not present

## 2023-05-21 DIAGNOSIS — D72829 Elevated white blood cell count, unspecified: Secondary | ICD-10-CM | POA: Diagnosis not present

## 2023-05-21 DIAGNOSIS — E876 Hypokalemia: Secondary | ICD-10-CM | POA: Diagnosis not present

## 2023-10-16 ENCOUNTER — Other Ambulatory Visit: Payer: Self-pay

## 2023-10-16 ENCOUNTER — Inpatient Hospital Stay (HOSPITAL_COMMUNITY)
Admission: AD | Admit: 2023-10-16 | Discharge: 2023-10-16 | Disposition: A | Discharge status: Home / Self Care | Attending: Obstetrics and Gynecology | Admitting: Obstetrics and Gynecology

## 2023-10-16 ENCOUNTER — Inpatient Hospital Stay (HOSPITAL_COMMUNITY)

## 2023-10-16 ENCOUNTER — Encounter (HOSPITAL_COMMUNITY): Payer: Self-pay | Admitting: Obstetrics and Gynecology

## 2023-10-16 DIAGNOSIS — O26891 Other specified pregnancy related conditions, first trimester: Secondary | ICD-10-CM | POA: Diagnosis present

## 2023-10-16 DIAGNOSIS — O98811 Other maternal infectious and parasitic diseases complicating pregnancy, first trimester: Secondary | ICD-10-CM | POA: Insufficient documentation

## 2023-10-16 DIAGNOSIS — R109 Unspecified abdominal pain: Secondary | ICD-10-CM

## 2023-10-16 DIAGNOSIS — Z3A01 Less than 8 weeks gestation of pregnancy: Secondary | ICD-10-CM | POA: Diagnosis not present

## 2023-10-16 DIAGNOSIS — B3731 Acute candidiasis of vulva and vagina: Secondary | ICD-10-CM | POA: Diagnosis not present

## 2023-10-16 DIAGNOSIS — O09521 Supervision of elderly multigravida, first trimester: Secondary | ICD-10-CM | POA: Insufficient documentation

## 2023-10-16 DIAGNOSIS — O26899 Other specified pregnancy related conditions, unspecified trimester: Secondary | ICD-10-CM

## 2023-10-16 DIAGNOSIS — R102 Pelvic and perineal pain: Secondary | ICD-10-CM | POA: Insufficient documentation

## 2023-10-16 DIAGNOSIS — O219 Vomiting of pregnancy, unspecified: Secondary | ICD-10-CM | POA: Diagnosis not present

## 2023-10-16 DIAGNOSIS — Z3491 Encounter for supervision of normal pregnancy, unspecified, first trimester: Secondary | ICD-10-CM

## 2023-10-16 LAB — URINALYSIS, ROUTINE W REFLEX MICROSCOPIC
Bilirubin Urine: NEGATIVE
Glucose, UA: NEGATIVE mg/dL
Ketones, ur: NEGATIVE mg/dL
Leukocytes,Ua: NEGATIVE
Nitrite: NEGATIVE
Protein, ur: 30 mg/dL — AB
Specific Gravity, Urine: 1.026 (ref 1.005–1.030)
pH: 6 (ref 5.0–8.0)

## 2023-10-16 LAB — CBC
HCT: 36.9 % (ref 36.0–46.0)
Hemoglobin: 12.7 g/dL (ref 12.0–15.0)
MCH: 32.9 pg (ref 26.0–34.0)
MCHC: 34.4 g/dL (ref 30.0–36.0)
MCV: 95.6 fL (ref 80.0–100.0)
Platelets: 262 10*3/uL (ref 150–400)
RBC: 3.86 MIL/uL — ABNORMAL LOW (ref 3.87–5.11)
RDW: 14 % (ref 11.5–15.5)
WBC: 10.5 10*3/uL (ref 4.0–10.5)
nRBC: 0 % (ref 0.0–0.2)

## 2023-10-16 LAB — POCT PREGNANCY, URINE: Preg Test, Ur: POSITIVE — AB

## 2023-10-16 LAB — WET PREP, GENITAL
Clue Cells Wet Prep HPF POC: NONE SEEN
Sperm: NONE SEEN
Trich, Wet Prep: NONE SEEN
WBC, Wet Prep HPF POC: >=10 — AB (ref <10)

## 2023-10-16 LAB — ABO/RH: ABO/RH(D): O POS

## 2023-10-16 LAB — HCG, QUANTITATIVE, PREGNANCY: hCG, Beta Chain, Quant, S: 218505 m[IU]/mL — ABNORMAL HIGH (ref <5)

## 2023-10-16 MED ORDER — TERCONAZOLE 0.8 % VA CREA: 1.0000 | TOPICAL_CREAM | Freq: Every day | VAGINAL | 0 refills | Status: AC

## 2023-10-16 MED ORDER — METOCLOPRAMIDE HCL 10 MG PO TABS: 10.0000 mg | ORAL_TABLET | Freq: Three times a day (TID) | ORAL | 0 refills | Status: AC | PRN

## 2023-10-16 NOTE — MAU Provider Note (Signed)
 History     CSN: [id number]  Arrival date and time: 10/16/23 1153   Event Date/Time   First Provider Initiated Contact with Patient 10/16/23 1345      Chief Complaint  Patient presents with   Pelvic Pain   HPI Jamie Ramos is a 38 y.o. G2P1001 at [redacted]w[redacted]d who presents for abdominal pain. Reports infrequent intermittent abdominal pain x 1+ week. Reports one episode of vomiting last week. Denies nausea since then. Denies diarrhea, dysuria, vaginal bleeding, or vaginal discharge.   OB History     Gravida  2   Para  1   Term  1   Preterm  0   AB  0   Living  1      SAB  0   IAB  0   Ectopic  0   Multiple  0   Live Births  1           Past Medical History:  Diagnosis Date   Chlamydia 2019    Past Surgical History:  Procedure Laterality Date   WISDOM TOOTH EXTRACTION      No family history on file.  Social History   Tobacco Use   Smoking status: Former   Smokeless tobacco: Never  Advertising account planner   Vaping status: Never Used  Substance Use Topics   Alcohol use: Not Currently   Drug use: No    Allergies: No Known Allergies  Medications Prior to Admission  Medication Sig Dispense Refill Last Dose/Taking   acetaminophen  (TYLENOL ) 500 MG tablet Take 500 mg by mouth every 6 (six) hours as needed for mild pain (pain score 1-3) or moderate pain (pain score 4-6).      MILI 0.25-35 MG-MCG tablet Take 1 tablet by mouth daily.       Review of Systems  All other systems reviewed and are negative.  Physical Exam   Blood pressure 123/74, pulse 78, temperature 98.4 F (36.9 C), temperature source Oral, resp. rate 18, height 5\' 2"  (1.575 m), weight 51.1 kg, last menstrual period 08/31/2023, SpO2 100%, unknown if currently breastfeeding.  Physical Exam Vitals and nursing note reviewed.  Constitutional:      General: She is not in acute distress.    Appearance: She is well-developed. She is not ill-appearing.  HENT:     Head: Normocephalic and atraumatic.   Eyes:     General: No scleral icterus.       Right eye: No discharge.        Left eye: No discharge.     Conjunctiva/sclera: Conjunctivae normal.  Pulmonary:     Effort: Pulmonary effort is normal. No respiratory distress.  Neurological:     General: No focal deficit present.     Mental Status: She is alert.  Psychiatric:        Mood and Affect: Mood normal.        Behavior: Behavior normal.     MAU Course  Procedures Results for orders placed or performed during the hospital encounter of 10/16/23 (from the past 24 hours)  Urinalysis, Routine w reflex microscopic -Urine, Clean Catch     Status: Abnormal   Collection Time: 10/16/23 12:24 PM  Result Value Ref Range   Color, Urine YELLOW YELLOW   APPearance HAZY (A) CLEAR   Specific Gravity, Urine 1.026 1.005 - 1.030   pH 6.0 5.0 - 8.0   Glucose, UA NEGATIVE NEGATIVE mg/dL   Hgb urine dipstick SMALL (A) NEGATIVE   Bilirubin Urine NEGATIVE NEGATIVE  Ketones, ur NEGATIVE NEGATIVE mg/dL   Protein, ur 30 (A) NEGATIVE mg/dL   Nitrite NEGATIVE NEGATIVE   Leukocytes,Ua NEGATIVE NEGATIVE   RBC / HPF 11-20 0 - 5 RBC/hpf   WBC, UA 0-5 0 - 5 WBC/hpf   Bacteria, UA RARE (A) NONE SEEN   Squamous Epithelial / HPF 0-5 0 - 5 /HPF   Mucus PRESENT   Wet prep, genital     Status: Abnormal   Collection Time: 10/16/23 12:24 PM   Specimen: Urine, Clean Catch  Result Value Ref Range   Yeast Wet Prep HPF POC PRESENT (A) NONE SEEN   Trich, Wet Prep NONE SEEN NONE SEEN   Clue Cells Wet Prep HPF POC NONE SEEN NONE SEEN   WBC, Wet Prep HPF POC >=10 (A) <10   Sperm NONE SEEN   Pregnancy, urine POC     Status: Abnormal   Collection Time: 10/16/23 12:26 PM  Result Value Ref Range   Preg Test, Ur POSITIVE (A) NEGATIVE  CBC     Status: Abnormal   Collection Time: 10/16/23 12:39 PM  Result Value Ref Range   WBC 10.5 4.0 - 10.5 K/uL   RBC 3.86 (L) 3.87 - 5.11 MIL/uL   Hemoglobin 12.7 12.0 - 15.0 g/dL   HCT 95.6 21.3 - 08.6 %   MCV 95.6 80.0 -  100.0 fL   MCH 32.9 26.0 - 34.0 pg   MCHC 34.4 30.0 - 36.0 g/dL   RDW 57.8 46.9 - 62.9 %   Platelets 262 150 - 400 K/uL   nRBC 0.0 0.0 - 0.2 %  ABO/Rh     Status: None   Collection Time: 10/16/23 12:39 PM  Result Value Ref Range   ABO/RH(D) O POS    No rh immune globuloin      NOT A RH IMMUNE GLOBULIN CANDIDATE, PT RH POSITIVE Performed at [facility], [address], [place], [place] [address]   hCG, quantitative, pregnancy     Status: Abnormal   Collection Time: 10/16/23 12:39 PM  Result Value Ref Range   hCG, Beta Chain, Quant, S 218,505 (H) <5 mIU/mL   US  OB Comp Less 14 Wks Result Date: 10/16/2023 CLINICAL DATA:  Left-sided pelvic pain for 1 week. EXAM: OBSTETRIC <14 WK ULTRASOUND TECHNIQUE: Transabdominal ultrasound was performed for evaluation of the gestation as well as the maternal uterus and adnexal regions. COMPARISON:  None Available. FINDINGS: Intrauterine gestational sac: Single Yolk sac:  Visualized. Embryo:  Visualized. Cardiac Activity: Visualized. Heart Rate: 133 bpm CRL:   10.3 mm   7 w 1 d                  US  EDC: 06/02/2024 Subchorionic hemorrhage:  None visualized. Maternal uterus/adnexae: There is a physiologic corpus luteal cyst in the left ovary. Ovarian blood flow is demonstrated. The right ovary is normal with ovarian blood flow. No adnexal mass. No pelvic free fluid. IMPRESSION: Single live intrauterine pregnancy estimated gestational age based on crown-rump length 7 weeks 1 day for ultrasound [facility] 06/02/2024. Electronically Signed   By: Chadwick Colonel M.D.   On: 10/16/2023 13:41    MDM   Assessment and Plan   1. Abdominal pain affecting pregnancy   2. Vaginal yeast infection   3. Normal IUP (intrauterine pregnancy) on prenatal ultrasound, first trimester   4. [redacted] weeks gestation of pregnancy   5. Nausea and vomiting during pregnancy    +UPT UA, wet prep, GC/chlamydia, CBC, ABO/Rh, quant hCG,  and US  today to rule out ectopic pregnancy which can be  life threatening.   Ultrasound shows live IUP measuring [redacted]w[redacted]d consistent with LMP dating.  -Wet prep positive for yeast. Terazol sent to pharmacy -Rx reglan prn nausea -GC/CT & urine culture pending -Reviewed return precautions -Start prenatal care  Terri Fester 10/16/2023, 2:15 PM

## 2023-10-16 NOTE — MAU Note (Signed)
 Jamie Ramos is a 38 y.o. at Unknown here in MAU reporting: she's been having pelvic pain for the past week, has taken Tylenol  and some relief noted.  Denies VB.  Reports +UPT at PCP's office.  States was diagnosed with BV but unable to take the prescribed meds.  LMP: 08/31/2023 Onset of complaint: 1 week  Pain score: 7 Vitals:   10/16/23 1207  BP: 123/74  Pulse: 78  Resp: 18  Temp: 98.4 F (36.9 C)  SpO2: 100%     FHT: NA  Lab orders placed from triage: UPT

## 2023-10-17 LAB — CULTURE, OB URINE
Culture: NO GROWTH
Special Requests: NORMAL

## 2023-10-18 LAB — GC/CHLAMYDIA PROBE AMP (~~LOC~~) NOT AT ARMC
Chlamydia: NEGATIVE
Comment: NEGATIVE
Comment: NORMAL
Neisseria Gonorrhea: NEGATIVE
